# Patient Record
Sex: Female | Born: 1935 | Race: White | Hispanic: No | Marital: Married | Smoking: Current every day smoker
Health system: Southern US, Community
[De-identification: ages and names within clinical notes are randomized; demographics above are authoritative.]

## PROBLEM LIST (undated history)

## (undated) DIAGNOSIS — E7800 Pure hypercholesterolemia, unspecified: Secondary | ICD-10-CM

## (undated) DIAGNOSIS — K5909 Other constipation: Secondary | ICD-10-CM

## (undated) DIAGNOSIS — M1990 Unspecified osteoarthritis, unspecified site: Secondary | ICD-10-CM

## (undated) DIAGNOSIS — R159 Full incontinence of feces: Secondary | ICD-10-CM

## (undated) DIAGNOSIS — E119 Type 2 diabetes mellitus without complications: Secondary | ICD-10-CM

## (undated) DIAGNOSIS — I10 Essential (primary) hypertension: Secondary | ICD-10-CM

## (undated) DIAGNOSIS — N289 Disorder of kidney and ureter, unspecified: Secondary | ICD-10-CM

## (undated) DIAGNOSIS — F4322 Adjustment disorder with anxiety: Secondary | ICD-10-CM

## (undated) DIAGNOSIS — E876 Hypokalemia: Secondary | ICD-10-CM

## (undated) DIAGNOSIS — E079 Disorder of thyroid, unspecified: Secondary | ICD-10-CM

## (undated) DIAGNOSIS — F17200 Nicotine dependence, unspecified, uncomplicated: Secondary | ICD-10-CM

## (undated) HISTORY — DX: Full incontinence of feces: R15.9

## (undated) HISTORY — DX: Disorder of kidney and ureter, unspecified: N28.9

## (undated) HISTORY — DX: Other constipation: K59.09

## (undated) HISTORY — DX: Hypokalemia: E87.6

## (undated) HISTORY — DX: Nicotine dependence, unspecified, uncomplicated: F17.200

## (undated) HISTORY — DX: Adjustment disorder with anxiety: F43.22

## (undated) HISTORY — DX: Unspecified osteoarthritis, unspecified site: M19.90

---

## 2016-07-25 ENCOUNTER — Encounter (HOSPITAL_COMMUNITY): Payer: Self-pay | Admitting: Emergency Medicine

## 2016-07-25 ENCOUNTER — Emergency Department (HOSPITAL_COMMUNITY)
Admission: EM | Admit: 2016-07-25 | Discharge: 2016-07-25 | Disposition: A | Discharge status: Home / Self Care | Payer: Medicare Other | Attending: Emergency Medicine | Admitting: Emergency Medicine

## 2016-07-25 DIAGNOSIS — E119 Type 2 diabetes mellitus without complications: Secondary | ICD-10-CM | POA: Diagnosis not present

## 2016-07-25 DIAGNOSIS — M545 Low back pain: Secondary | ICD-10-CM | POA: Diagnosis present

## 2016-07-25 DIAGNOSIS — I10 Essential (primary) hypertension: Secondary | ICD-10-CM | POA: Diagnosis not present

## 2016-07-25 DIAGNOSIS — F17210 Nicotine dependence, cigarettes, uncomplicated: Secondary | ICD-10-CM | POA: Diagnosis not present

## 2016-07-25 DIAGNOSIS — M62830 Muscle spasm of back: Secondary | ICD-10-CM | POA: Diagnosis not present

## 2016-07-25 HISTORY — DX: Disorder of thyroid, unspecified: E07.9

## 2016-07-25 HISTORY — DX: Essential (primary) hypertension: I10

## 2016-07-25 HISTORY — DX: Pure hypercholesterolemia, unspecified: E78.00

## 2016-07-25 HISTORY — DX: Type 2 diabetes mellitus without complications: E11.9

## 2016-07-25 LAB — URINALYSIS, ROUTINE W REFLEX MICROSCOPIC
BILIRUBIN URINE: NEGATIVE
GLUCOSE, UA: NEGATIVE mg/dL
HGB URINE DIPSTICK: NEGATIVE
Ketones, ur: NEGATIVE mg/dL
Leukocytes, UA: NEGATIVE
Nitrite: NEGATIVE
PROTEIN: NEGATIVE mg/dL
Specific Gravity, Urine: 1.018 (ref 1.005–1.030)
pH: 5.5 (ref 5.0–8.0)

## 2016-07-25 LAB — CBC WITH DIFFERENTIAL/PLATELET
Basophils Absolute: 0 10*3/uL (ref 0.0–0.1)
Basophils Relative: 0 %
EOS ABS: 0.1 10*3/uL (ref 0.0–0.7)
EOS PCT: 1 %
HCT: 41 % (ref 36.0–46.0)
Hemoglobin: 14.2 g/dL (ref 12.0–15.0)
LYMPHS ABS: 0.9 10*3/uL (ref 0.7–4.0)
LYMPHS PCT: 6 %
MCH: 32.7 pg (ref 26.0–34.0)
MCHC: 34.6 g/dL (ref 30.0–36.0)
MCV: 94.5 fL (ref 78.0–100.0)
MONO ABS: 1.7 10*3/uL — AB (ref 0.1–1.0)
MONOS PCT: 11 %
Neutro Abs: 11.8 10*3/uL — ABNORMAL HIGH (ref 1.7–7.7)
Neutrophils Relative %: 82 %
PLATELETS: 232 10*3/uL (ref 150–400)
RBC: 4.34 MIL/uL (ref 3.87–5.11)
RDW: 13.7 % (ref 11.5–15.5)
WBC: 14.5 10*3/uL — ABNORMAL HIGH (ref 4.0–10.5)

## 2016-07-25 LAB — BASIC METABOLIC PANEL
Anion gap: 12 (ref 5–15)
BUN: 19 mg/dL (ref 6–20)
CHLORIDE: 98 mmol/L — AB (ref 101–111)
CO2: 28 mmol/L (ref 22–32)
CREATININE: 0.72 mg/dL (ref 0.44–1.00)
Calcium: 9.9 mg/dL (ref 8.9–10.3)
GFR calc Af Amer: >60 mL/min (ref >60)
GFR calc non Af Amer: >60 mL/min (ref >60)
GLUCOSE: 155 mg/dL — AB (ref 65–99)
POTASSIUM: 3.1 mmol/L — AB (ref 3.5–5.1)
SODIUM: 138 mmol/L (ref 135–145)

## 2016-07-25 MED ORDER — POTASSIUM CHLORIDE CRYS ER 20 MEQ PO TBCR
40.0000 meq | EXTENDED_RELEASE_TABLET | Freq: Once | ORAL | Status: DC
  Filled 2016-07-25: qty 2

## 2016-07-25 MED ORDER — FENTANYL CITRATE (PF) 100 MCG/2ML IJ SOLN
50.0000 ug | Freq: Once | INTRAMUSCULAR | Status: AC
  Administered 2016-07-25: 50 ug via INTRAVENOUS
  Filled 2016-07-25: qty 2

## 2016-07-25 MED ORDER — MORPHINE SULFATE (PF) 2 MG/ML IV SOLN
4.0000 mg | Freq: Once | INTRAVENOUS | Status: AC
  Administered 2016-07-25: 2 mg via INTRAVENOUS
  Filled 2016-07-25: qty 2

## 2016-07-25 NOTE — ED Notes (Signed)
Discharge instructions and follow up care reviewed with patient. Patient verbalized understanding. 

## 2016-07-25 NOTE — ED Notes (Signed)
Pt able to ambulate w/o assistance and with steady gait Pt rates pain across shoulder blades at 8/10 Pt is now c/o pain to her bilateral groin when ambulating Pt states "it's harder to get out bed but when I walk the pain is a little better"

## 2016-07-25 NOTE — ED Provider Notes (Signed)
[facility]-EMERGENCY DEPT Provider Note   CSN: [id number] Arrival date & time: 07/25/16  [id number]  By signing my name below, I, Emmanuella Mensah, attest that this documentation has been prepared under the direction and in the presence of Zadie Rhineonald Hosey Burmester, MD. Electronically Signed: Angelene GiovanniEmmanuella Mensah, ED Scribe. 07/25/16. 3:18 AM.   History   Chief Complaint Chief Complaint  Patient presents with  . Back Pain    HPI Comments: Rachel Flynn is a 81 y.o. female with a hx of DM, high cholesterol, and hypertension brought in by ambulance, who presents to the Emergency Department complaining of gradually worsening moderate pain to her entire spine onset yesterday morning when she woke up. She reports associated difficulty ambulating due to the pain. She notes that the pain is worse with deep breathing and movement. She explains that she has been having persistent moderate generalized headaches onset 06/12/16 that has been gradually radiating down her posterior neck and now the pain is in her spine. She adds that she has a scheduled brain MRI tomorrow because of these headaches. She denies any recent falls, injuries, or trauma. She states that she believes her symptoms are attributed to stress as she lost her son last year and another son is going through a divorce. No alleviating factors noted. She states that she took Aleve 2 hours ago with no relief. She denies any recent travel outside the country, unexpected weight loss, severe infections, or tick exposure. She also denies any fever, chills, chest pain, SOB, syncope, numbness/weakness to BLE, bowel/bladder incontinence, or any other symptoms.   The history is provided by the patient. No language interpreter was used.  Back Pain   This is a new problem. The current episode started yesterday. The problem has been gradually worsening. The pain is associated with no known injury. The pain is present in the thoracic spine and lumbar spine. The pain is moderate. The  pain is the same all the time. Associated symptoms include headaches. Pertinent negatives include no chest pain, no fever, no numbness, no bowel incontinence, no bladder incontinence and no weakness. She has tried NSAIDs for the symptoms.    Past Medical History:  Diagnosis Date  . Diabetes mellitus without complication (HCC)   . High cholesterol   . Hypertension   . Thyroid disease     There are no active problems to display for this patient.   History reviewed. No pertinent surgical history.  OB History    No data available       Home Medications    Prior to Admission medications   Not on File    Family History History reviewed. No pertinent family history.  Social History Social History  Substance Use Topics  . Smoking status: Current Every Day Smoker    Types: Cigarettes  . Smokeless tobacco: Never Used  . Alcohol use Yes     Allergies   Patient has no known allergies.   Review of Systems Review of Systems  Constitutional: Negative for chills, diaphoresis, fever and unexpected weight change.  Respiratory: Negative for shortness of breath.   Cardiovascular: Negative for chest pain.  Gastrointestinal: Negative for bowel incontinence.  Genitourinary: Negative for bladder incontinence.  Musculoskeletal: Positive for back pain and neck pain.  Neurological: Positive for headaches. Negative for syncope, weakness and numbness.  All other systems reviewed and are negative.    Physical Exam Updated Vital Signs BP 141/65   Pulse 94   Temp 98.9 F (37.2 C) (Oral)   Resp 17  SpO2 96%   Physical Exam  Nursing note and vitals reviewed.  CONSTITUTIONAL: Well developed/well nourished HEAD: Normocephalic/atraumatic EYES: EOMI/PERRL ENMT: Mucous membranes moist NECK: supple no meningeal signs SPINE/BACK:diffuse spinal tenderness; no erythema noted CV: S1/S2 noted, no murmurs/rubs/gallops noted LUNGS: Lungs are clear to auscultation bilaterally, no  apparent distress ABDOMEN: soft, nontender, no rebound or guarding, bowel sounds noted throughout abdomen GU:no cva tenderness NEURO: Pt is awake/alert/appropriate, moves all extremitiesx4.  No facial droop.  No arm or leg drift; equal power noted in BLE EXTREMITIES: pulses normal/equal, full ROM SKIN: warm, color normal PSYCH: Anxious    ED Treatments / Results  DIAGNOSTIC STUDIES: Oxygen Saturation is 96% on RA, normal by my interpretation.    COORDINATION OF CARE: 3:14 AM- Pt advised of plan for treatment and pt agrees. Pt will receive lab work for further evaluation. She will also receive Fentanyl IM.    Labs (all labs ordered are listed, but only abnormal results are displayed) Labs Reviewed  BASIC METABOLIC PANEL - Abnormal; Notable for the following:       Result Value   Potassium 3.1 (*)    Chloride 98 (*)    Glucose, Bld 155 (*)    All other components within normal limits  CBC WITH DIFFERENTIAL/PLATELET - Abnormal; Notable for the following:    WBC 14.5 (*)    Neutro Abs 11.8 (*)    Monocytes Absolute 1.7 (*)    All other components within normal limits  URINALYSIS, ROUTINE W REFLEX MICROSCOPIC (NOT AT [facility])    EKG  EKG Interpretation None       Radiology No results found.  Procedures Procedures (including critical care time)  Medications Ordered in ED Medications  potassium chloride SA (K-DUR,KLOR-CON) CR tablet 40 mEq (40 mEq Oral Refused 07/25/16 0508)  fentaNYL (SUBLIMAZE) injection 50 mcg (50 mcg Intravenous Given 07/25/16 0335)  morphine 2 MG/ML injection 4 mg (2 mg Intravenous Given 07/25/16 0453)     Initial Impression / Assessment and Plan / ED Course  Zadie Rhineonald Jaid Quirion, MD has reviewed the triage vital signs and the nursing notes.  Pertinent labs results that were available during my care of the patient were reviewed by me and considered in my medical decision making (see chart for details).  Clinical Course     Pt well appearing She has  no focal weakness in her lower extremities Her neck is supple She is now ambulatory On repeat exam she has diffuse muscle tenderness to her back   But no signs of trauma, no erythema noted No signs of acute neurologic emergency Her HA has been ongoing since September, defer further workup and she has MRI brain scheduled for tomorrow For her HA they have been monitoring her sed rate but did not have temporal arteritis per patient.     She will take an extra zanaflex for muscle spasms as she has this at home  Final Clinical Impressions(s) / ED Diagnoses   Final diagnoses:  Muscle spasm of back    New Prescriptions New Prescriptions   No medications on file   I personally performed the services described in this documentation, which was scribed in my presence. The recorded information has been reviewed and is accurate.        Zadie Rhineonald Jeanie Mccard, MD 07/25/16 [phone]

## 2016-07-25 NOTE — ED Notes (Signed)
Pt able to ambulate to the bathroom with standby assist Pt with steady gait

## 2016-07-25 NOTE — Discharge Instructions (Signed)

## 2016-07-25 NOTE — ED Notes (Signed)
Pt is scheduled for a MRI at [facility] on November 8

## 2016-07-25 NOTE — ED Triage Notes (Signed)
Pt brought in by EMS with c/o back pain  Pt states she thinks it is from stress  Denies injury or fall  Pt denies neck pain  Pt states she is scheduled for a MRI at [facility] on Nov 8th

## 2016-07-25 NOTE — ED Notes (Signed)
ED Provider at bedside. 

## 2017-12-05 ENCOUNTER — Ambulatory Visit (HOSPITAL_COMMUNITY): Payer: Medicare Other | Admitting: Psychiatry

## 2019-10-27 ENCOUNTER — Ambulatory Visit: Payer: Medicare Other | Admitting: Podiatry

## 2020-02-03 ENCOUNTER — Encounter: Payer: Self-pay | Admitting: *Deleted

## 2020-02-04 ENCOUNTER — Ambulatory Visit (INDEPENDENT_AMBULATORY_CARE_PROVIDER_SITE_OTHER): Payer: Medicare Other | Admitting: Gastroenterology

## 2020-02-04 ENCOUNTER — Encounter: Payer: Self-pay | Admitting: Gastroenterology

## 2020-02-04 VITALS — BP 124/76 | HR 85 | Ht 63.5 in | Wt 148.4 lb

## 2020-02-04 DIAGNOSIS — R194 Change in bowel habit: Secondary | ICD-10-CM | POA: Insufficient documentation

## 2020-02-04 DIAGNOSIS — K5900 Constipation, unspecified: Secondary | ICD-10-CM

## 2020-02-04 DIAGNOSIS — R159 Full incontinence of feces: Secondary | ICD-10-CM | POA: Diagnosis not present

## 2020-02-04 NOTE — Progress Notes (Signed)
02/04/2020 Rachel Flynn [id number] 1935-03-16   HISTORY OF PRESENT ILLNESS: This is a pleasant 85 year old female who is new to our office.  Has been referred here by Dr. Rozetta Nunnery for evaluation regarding fecal incontinence and diarrhea.  The patient tells me that she does not have diarrhea.  She says that all of her life she has not had any problems moving her bowels.  She says that in January she took a medication for a couple of days that given to her by her podiatrist and it seemed to constipate her.  She has had some issues with what sounds like just mild constipation since that time.  Then, what prompted this visit was that over the past couple weeks she has had 2 episodes of fecal incontinence.  She says that the stools were not diarrhea, but were softer than normal, just urgent and came out.  She says that her last colonoscopy was over 10 years ago.  Has had them regularly all her life.  Recalls that some point in the past she had polyps removed.  Unfortunately we do not have any records of those.  She denies any rectal bleeding.  Denies any weight loss.  Denies any abdominal pain.   Past Medical History:  Diagnosis Date  . Adjustment disorder with anxious mood   . Arthritis   . Chronic constipation   . Diabetes mellitus without complication ([place])   . Fecal incontinence   . High cholesterol   . Hypertension   . Hypokalemia   . Renal insufficiency   . Thyroid disease   . Tobacco dependence    History reviewed. No pertinent surgical history.  reports that she has been smoking cigarettes. She has never used smokeless tobacco. She reports current alcohol use. She reports that she does not use drugs. family history is not on file. Allergies  Allergen Reactions  . Valsartan Swelling    Edema  . Nifedipine Other (See Comments)    Constipation  . Ace Inhibitors Other (See Comments)    Cough Cough   . Amoxicillin-Pot Clavulanate Nausea And Vomiting  . Sertraline Nausea Only        Outpatient Encounter Medications as of 02/04/2020  Medication Sig  . amLODipine (NORVASC) 5 MG tablet Take 5 mg by mouth every morning.  Marland Kitchen aspirin 81 MG EC tablet Take by mouth.  Marland Kitchen atorvastatin (LIPITOR) 40 MG tablet Take 40 mg by mouth daily.  . Blood Glucose Monitoring Suppl (ONETOUCH VERIO) w/Device KIT Use as directed  . Desoximetasone 0.05 % GEL Apply 1 application topically daily as needed (skin).   Marland Kitchen glucose blood (ONETOUCH VERIO) test strip CHECK BLOOD SUGARS DAILY AND AS NEEDED (E11.9)  . ibuprofen (ADVIL,MOTRIN) 600 MG tablet Take 600 mg by mouth every 8 (eight) hours as needed for mild pain or moderate pain.   Marland Kitchen LORazepam (ATIVAN) 0.5 MG tablet Take by mouth.  . nabumetone (RELAFEN) 500 MG tablet Take by mouth.  Marland Kitchen NIFEdipine (PROCARDIA-XL/NIFEDICAL-XL) 30 MG 24 hr tablet Take by mouth.  . Omega-3 1000 MG CAPS Take by mouth.  Glory Rosebush Delica Lancets 62H MISC Use 1 lancet daily to test blood sugar (dx E11.9)  . Tafluprost, PF, 0.0015 % SOLN Place 1 drop into both eyes nightly.  . tizanidine (ZANAFLEX) 2 MG capsule Take by mouth.  . valsartan-hydrochlorothiazide (DIOVAN-HCT) 320-12.5 MG tablet Take 1 tablet by mouth every morning.  . [DISCONTINUED] escitalopram (LEXAPRO) 5 MG tablet Take 5 mg by mouth every morning.  . [  DISCONTINUED] levothyroxine (SYNTHROID, LEVOTHROID) 100 MCG tablet Take 100 mcg by mouth every morning.  . [DISCONTINUED] metFORMIN (GLUCOPHAGE) 500 MG tablet Take 500 mg by mouth 2 (two) times daily.   No facility-administered encounter medications on file as of 02/04/2020.     REVIEW OF SYSTEMS  : All other systems reviewed and negative except where noted in the History of Present Illness.   PHYSICAL EXAM: BP 124/76   Pulse 85   Ht 5' 3.5" (1.613 m)   Wt 148 lb 6 oz (67.3 kg)   BMI 25.87 kg/m  General: Well developed white female in no acute distress Head: Normocephalic and atraumatic Eyes:  Sclerae anicteric, conjunctiva pink. Ears: Normal auditory  acuity Lungs: Clear throughout to auscultation; no increased WOB. Heart: Regular rate and rhythm; no M/R/G. Abdomen: Soft, non-distended.  BS present.  Non-tender. Musculoskeletal: Symmetrical with no gross deformities  Skin: No lesions on visible extremities Extremities: No edema  Neurological: Alert oriented x 4, grossly non-focal Psychological:  Alert and cooperative. Normal mood and affect  ASSESSMENT AND PLAN: *85 year old female with change in bowel habits with mild constipation since January and then 2 episodes of fecal incontinence over the past couple of weeks.  Has not had a colonoscopy in well over 10 years.  We discussed this as she is in very good health.  She will consider this.  Incontinence episodes likely due to pelvic floor weakness/sphincter weakness.  We discussed Kegel exercises and she was given literature on this.  She will also start a powder fiber supplement such as Benefiber, 2 teaspoons in 8 ounces of liquid daily.  Can increase to twice daily if needed.  I have asked her to call us back in about 4 to 5 weeks with an update on her symptoms and to discuss on if she wants to proceed with colonoscopy.   CC:  Loraine Leriche.,* CC:  Dr. Rozetta Nunnery

## 2020-02-04 NOTE — Progress Notes (Signed)
Agree with the assessment and plan as outlined by [name], PA-C. ? ?Rachel Dunlevy, DO, FACG ? ?

## 2020-02-04 NOTE — Patient Instructions (Signed)
If you are age 83 or older, your body mass index should be between 23-30. Your Body mass index is 25.87 kg/m. If this is out of the aforementioned range listed, please consider follow up with your Primary Care Provider.  If you are age 9 or younger, your body mass index should be between 19-25. Your Body mass index is 25.87 kg/m. If this is out of the aformentioned range listed, please consider follow up with your Primary Care Provider.   Please purchase Benefiber over the counter. Take 2 tablespoon in 8 ounces of liquid daily and increase to twice daily if needed.  Call back in 4-5 weeks with an update, ask for [employer].  Consider scheduling colonoscopy.  Kegel Exercises  Kegel exercises can help strengthen your pelvic floor muscles. The pelvic floor is a group of muscles that support your rectum, small intestine, and bladder. In females, pelvic floor muscles also help support the womb (uterus). These muscles help you control the flow of urine and stool. Kegel exercises are painless and simple, and they do not require any equipment. Your provider may suggest Kegel exercises to:  Improve bladder and bowel control.  Improve sexual response.  Improve weak pelvic floor muscles after surgery to remove the uterus (hysterectomy) or pregnancy (females).  Improve weak pelvic floor muscles after prostate gland removal or surgery (males). Kegel exercises involve squeezing your pelvic floor muscles, which are the same muscles you squeeze when you try to stop the flow of urine or keep from passing gas. The exercises can be done while sitting, standing, or lying down, but it is best to vary your position. Exercises How to do Kegel exercises: 1. Squeeze your pelvic floor muscles tight. You should feel a tight lift in your rectal area. If you are a female, you should also feel a tightness in your vaginal area. Keep your stomach, buttocks, and legs relaxed. 2. Hold the muscles tight for up to 10  seconds. 3. Breathe normally. 4. Relax your muscles. 5. Repeat as told by your health care provider. Repeat this exercise daily as told by your health care provider. Continue to do this exercise for at least 4-6 weeks, or for as long as told by your health care provider. You may be referred to a physical therapist who can help you learn more about how to do Kegel exercises. Depending on your condition, your health care provider may recommend:  Varying how long you squeeze your muscles.  Doing several sets of exercises every day.  Doing exercises for several weeks.  Making Kegel exercises a part of your regular exercise routine. This information is not intended to replace advice given to you by your health care provider. Make sure you discuss any questions you have with your health care provider. Document Revised: 04/24/2018 Document Reviewed: 04/24/2018 Elsevier Patient Education  2020 [employer].

## 2021-12-09 ENCOUNTER — Emergency Department (HOSPITAL_BASED_OUTPATIENT_CLINIC_OR_DEPARTMENT_OTHER): Payer: Medicare Other

## 2021-12-09 ENCOUNTER — Encounter (HOSPITAL_BASED_OUTPATIENT_CLINIC_OR_DEPARTMENT_OTHER): Payer: Self-pay | Admitting: Emergency Medicine

## 2021-12-09 ENCOUNTER — Emergency Department (HOSPITAL_BASED_OUTPATIENT_CLINIC_OR_DEPARTMENT_OTHER)
Admission: EM | Admit: 2021-12-09 | Discharge: 2021-12-09 | Disposition: A | Discharge status: Home / Self Care | Payer: Medicare Other | Attending: Emergency Medicine | Admitting: Emergency Medicine

## 2021-12-09 DIAGNOSIS — I10 Essential (primary) hypertension: Secondary | ICD-10-CM | POA: Insufficient documentation

## 2021-12-09 DIAGNOSIS — Z7982 Long term (current) use of aspirin: Secondary | ICD-10-CM | POA: Insufficient documentation

## 2021-12-09 DIAGNOSIS — Y9301 Activity, walking, marching and hiking: Secondary | ICD-10-CM | POA: Diagnosis not present

## 2021-12-09 DIAGNOSIS — M79642 Pain in left hand: Secondary | ICD-10-CM | POA: Diagnosis not present

## 2021-12-09 DIAGNOSIS — Z87891 Personal history of nicotine dependence: Secondary | ICD-10-CM | POA: Diagnosis not present

## 2021-12-09 DIAGNOSIS — W01198A Fall on same level from slipping, tripping and stumbling with subsequent striking against other object, initial encounter: Secondary | ICD-10-CM | POA: Diagnosis not present

## 2021-12-09 DIAGNOSIS — S0181XA Laceration without foreign body of other part of head, initial encounter: Secondary | ICD-10-CM | POA: Diagnosis not present

## 2021-12-09 DIAGNOSIS — S62613A Displaced fracture of proximal phalanx of left middle finger, initial encounter for closed fracture: Secondary | ICD-10-CM | POA: Diagnosis not present

## 2021-12-09 DIAGNOSIS — S62615A Displaced fracture of proximal phalanx of left ring finger, initial encounter for closed fracture: Secondary | ICD-10-CM | POA: Diagnosis not present

## 2021-12-09 DIAGNOSIS — S6992XA Unspecified injury of left wrist, hand and finger(s), initial encounter: Secondary | ICD-10-CM | POA: Diagnosis present

## 2021-12-09 DIAGNOSIS — Z79899 Other long term (current) drug therapy: Secondary | ICD-10-CM | POA: Diagnosis not present

## 2021-12-09 DIAGNOSIS — E119 Type 2 diabetes mellitus without complications: Secondary | ICD-10-CM | POA: Diagnosis not present

## 2021-12-09 MED ORDER — ACETAMINOPHEN 325 MG PO TABS
650.0000 mg | ORAL_TABLET | Freq: Once | ORAL | Status: AC
  Administered 2021-12-09: 650 mg via ORAL
  Filled 2021-12-09: qty 2

## 2021-12-09 MED ORDER — TETANUS-DIPHTH-ACELL PERTUSSIS 5-2.5-18.5 LF-MCG/0.5 IM SUSY
0.5000 mL | PREFILLED_SYRINGE | Freq: Once | INTRAMUSCULAR | Status: DC
  Filled 2021-12-09: qty 0.5

## 2021-12-09 MED ORDER — OXYCODONE-ACETAMINOPHEN 5-325 MG PO TABS
1.0000 | ORAL_TABLET | Freq: Once | ORAL | Status: DC
Start: 2021-12-09 — End: 2021-12-09
  Filled 2021-12-09: qty 1

## 2021-12-09 NOTE — ED Triage Notes (Signed)
Pt presents with laceration to left eyebrow and to left middle finger post mechanical fall. Endorses headache. Denies loc, blood thinners. ?

## 2021-12-09 NOTE — ED Triage Notes (Signed)
Pt arrived ems after tripping and falling  denies loc denies back pain  no blood thinners  has lac to left forehead, and finger lac ?

## 2021-12-09 NOTE — Discharge Instructions (Signed)
Please call Dr. Debby Bud office Monday morning to schedule an appointment.  Please take Tylenol and/or ibuprofen for pain.  Return to the emergency department for any worsening symptoms. ?

## 2021-12-09 NOTE — ED Provider Notes (Signed)
?[facility] ?Provider Note ? ? ?CSN: [id number] ?Arrival date & time: 12/09/21  1355 ? ?  ? ?History ? ?Chief Complaint  ?Patient presents with  ? Fall  ? Facial Laceration  ? ? ?Rachel Flynn is a 87 y.o. female who presents to the emergency department with left hand pain and a facial laceration that occurred after mechanical trip and fall earlier today.  Patient's was walking when the sole of her shoe got stuck on the floor and she fell forward.  She did hit her head but did not lose consciousness.  She is not anticoagulated.  She complains of a facial laceration localized to the left eyebrow and left hand pain and swelling.  She denies any other injury. ? ? ?Fall ? ? ?  ? ?Home Medications ?Prior to Admission medications   ?Medication Sig Start Date End Date Taking? Authorizing Provider  ?amLODipine (NORVASC) 5 MG tablet Take 5 mg by mouth every morning. 07/06/16   [provider]  ?aspirin 81 MG EC tablet Take by mouth.    [provider]  ?atorvastatin (LIPITOR) 40 MG tablet Take 40 mg by mouth daily. 05/19/16   [provider]  ?Blood Glucose Monitoring Suppl (ONETOUCH VERIO) w/Device KIT Use as directed 10/29/19   [provider]  ?Desoximetasone 0.05 % GEL Apply 1 application topically daily as needed (skin).  05/30/16   [provider]  ?glucose blood (ONETOUCH VERIO) test strip CHECK BLOOD SUGARS DAILY AND AS NEEDED (E11.9) 01/06/20   [provider]  ?ibuprofen (ADVIL,MOTRIN) 600 MG tablet Take 600 mg by mouth every 8 (eight) hours as needed for mild pain or moderate pain.  07/08/16   [provider]  ?nabumetone (RELAFEN) 500 MG tablet Take by mouth. 09/09/19   [provider]  ?NIFEdipine (PROCARDIA-XL/NIFEDICAL-XL) 30 MG 24 hr tablet Take by mouth. 10/21/19   [provider]  ?Omega-3 1000 MG CAPS Take by mouth.    [provider]  ?Jonetta Speak Lancets 94R MISC Use 1 lancet daily to test  blood sugar (dx E11.9) 05/03/16   [provider]  ?Tafluprost, PF, 0.0015 % SOLN Place 1 drop into both eyes nightly. 08/05/18   [provider]  ?tizanidine (ZANAFLEX) 2 MG capsule Take by mouth. 09/30/19   [provider]  ?valsartan-hydrochlorothiazide (DIOVAN-HCT) 320-12.5 MG tablet Take 1 tablet by mouth every morning. 06/26/16   [provider]  ?   ? ?Allergies    ?Valsartan, Nifedipine, Ace inhibitors, Amoxicillin-pot clavulanate, and Sertraline   ? ?Review of Systems   ?Review of Systems  ?All other systems reviewed and are negative. ? ?Physical Exam ?Updated Vital Signs ?BP (!) 175/70   Pulse (!) 55   Temp 98.8 ?F (37.1 ?C) (Oral)   Resp 18   SpO2 95%  ?Physical Exam ?Vitals and nursing note reviewed.  ?Constitutional:   ?   General: She is not in acute distress. ?   Appearance: Normal appearance.  ?HENT:  ?   Head: Normocephalic.  ?   Comments: 4 cm well approximated superficial linear laceration above the left eyebrow.  Bleeding is controlled.  No tenderness to the orbits.  No obvious deformity.  Patient tracks appropriately without any entrapment. ?Eyes:  ?   General:     ?   Right eye: No discharge.     ?   Left eye: No discharge.  ?Cardiovascular:  ?   Comments: Regular rate and rhythm.  S1/S2 are distinct without  any evidence of murmur, rubs, or gallops.  Radial pulses are 2+ bilaterally.  Dorsalis pedis pulses are 2+ bilaterally.  No evidence of pedal edema. ?Pulmonary:  ?   Comments: Clear to auscultation bilaterally.  Normal effort.  No respiratory distress.  No evidence of wheezes, rales, or rhonchi heard throughout. ?Abdominal:  ?   General: Abdomen is flat. Bowel sounds are normal. There is no distension.  ?   Tenderness: There is no abdominal tenderness. There is no guarding or rebound.  ?Musculoskeletal:     ?   General: Normal range of motion.  ?   Cervical back: Neck supple.  ?   Comments: Swelling and ecchymosis to the third and fourth fingers on the  left hand.  2+ radial pulse felt on the left wrist.  ?Skin: ?   General: Skin is warm and dry.  ?   Findings: No rash.  ?Neurological:  ?   General: No focal deficit present.  ?   Mental Status: She is alert.  ?Psychiatric:     ?   Mood and Affect: Mood normal.     ?   Behavior: Behavior normal.  ? ? ?ED Results / Procedures / Treatments   ?Labs ?(all labs ordered are listed, but only abnormal results are displayed) ?Labs Reviewed - No data to display ? ?EKG ?None ? ?Radiology ?CT Head Wo Contrast ? ?Result Date: 12/09/2021 ?CLINICAL DATA:  87 year old female with a history of head trauma EXAM: CT HEAD WITHOUT CONTRAST CT MAXILLOFACIAL WITHOUT CONTRAST CT CERVICAL SPINE WITHOUT CONTRAST TECHNIQUE: Multidetector CT imaging of the head, cervical spine, and maxillofacial structures were performed using the standard protocol without intravenous contrast. Multiplanar CT image reconstructions of the cervical spine and maxillofacial structures were also generated. RADIATION DOSE REDUCTION: This exam was performed according to the departmental dose-optimization program which includes automated exposure control, adjustment of the mA and/or kV according to patient size and/or use of iterative reconstruction technique. COMPARISON:  01/28/2021 FINDINGS: CT HEAD FINDINGS Brain: No acute intracranial hemorrhage. No midline shift or mass effect. Gray-white differentiation maintained. Mild volume loss. Minimal periventricular patchy hypodensity. Unremarkable appearance of the ventricular system. Vascular: Calcifications of the intracranial vasculature Skull: No acute fracture.  No aggressive bone lesion identified. Sinuses/Orbits: Unremarkable appearance of the orbits. Mastoid air cells clear. No middle ear effusion. No significant sinus disease. CT MAXILLOFACIAL FINDINGS Osseous: No fracture or mandibular dislocation. No destructive process. Orbits: Unremarkable orbits. Sinuses: Unremarkable appearance of the paranasal sinuses.  Soft tissues: Unremarkable CT CERVICAL SPINE FINDINGS Alignment: Craniocervical junction aligned. Anatomic alignment of the cervical elements. No subluxation. Skull base and vertebrae: No acute fracture at the skullbase. Vertebral body heights relatively maintained. No acute fracture identified. Soft tissues and spinal canal: Unremarkable cervical soft tissues. Lymph nodes are present, though not enlarged. Disc levels: Disc space narrowing most pronounced spanning the levels of C3-C7 with endplate sclerosis uncovertebral joint disease and anterior osteophyte production. No significant bony canal narrowing. Uncovertebral joint disease and associated facet disease contributes to varying degrees of bilateral neural foraminal narrowing. Upper chest: Unremarkable appearance of the lung apices. Other: Carotid calcifications. IMPRESSION: Head CT: Negative for intracranial abnormality Maxillofacial CT: Negative Cervical spine CT: No acute fracture or malalignment of the cervical spine. Electronically Signed   By: Corrie Mckusick D.O.   On: 12/09/2021 15:10  ? ?CT Cervical Spine Wo Contrast ? ?Result Date: 12/09/2021 ?CLINICAL DATA:  87 year old female with a history of head trauma EXAM: CT HEAD WITHOUT CONTRAST CT MAXILLOFACIAL WITHOUT  CONTRAST CT CERVICAL SPINE WITHOUT CONTRAST TECHNIQUE: Multidetector CT imaging of the head, cervical spine, and maxillofacial structures were performed using the standard protocol without intravenous contrast. Multiplanar CT image reconstructions of the cervical spine and maxillofacial structures were also generated. RADIATION DOSE REDUCTION: This exam was performed according to the departmental dose-optimization program which includes automated exposure control, adjustment of the mA and/or kV according to patient size and/or use of iterative reconstruction technique. COMPARISON:  01/28/2021 FINDINGS: CT HEAD FINDINGS Brain: No acute intracranial hemorrhage. No midline shift or mass effect.  Gray-white differentiation maintained. Mild volume loss. Minimal periventricular patchy hypodensity. Unremarkable appearance of the ventricular system. Vascular: Calcifications of the intracranial vasculature Skull

## 2022-05-02 IMAGING — CT CT HEAD W/O CM
3 series · 14 of 47 positions shown, 16 images · non-contrast
Comparison: 01/28/2021

CLINICAL DATA: 86-year-old female with a history of head trauma



[Series 2: head wo · axial · 0.42mm/px · z∈[-183,-53]mm · 8 of 32 slices shown, 10 images]
[im 3/32  brain]
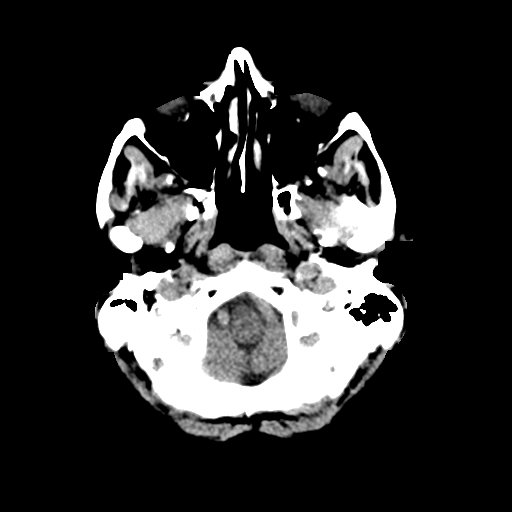
[im 3/32  bone]
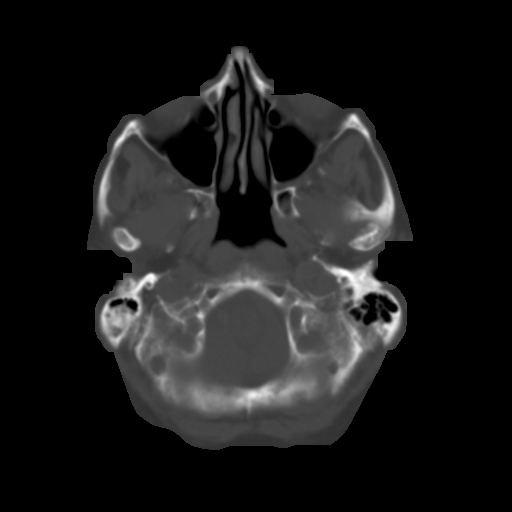
[im 7/32  brain]
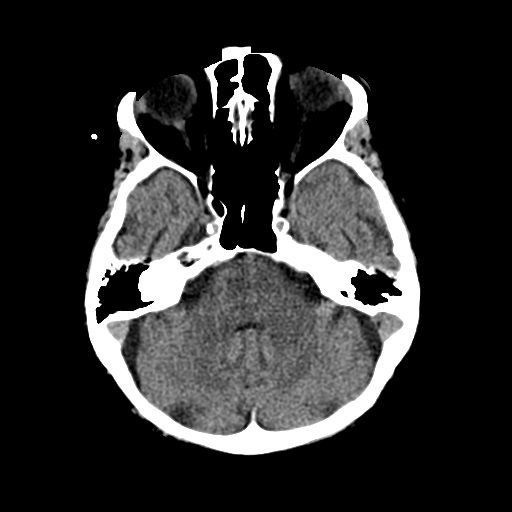
[im 10/32  brain]
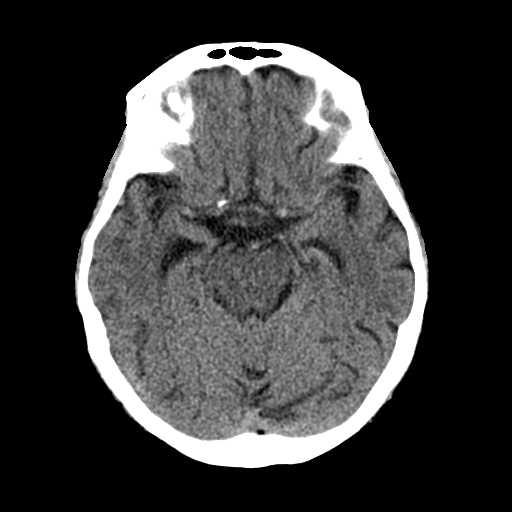
[im 14/32  brain]
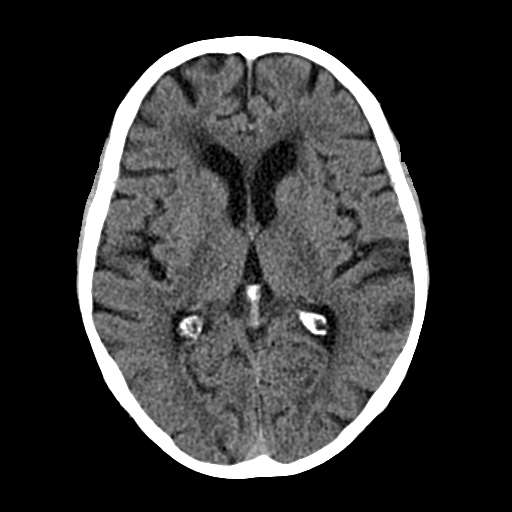
[im 18/32  brain]
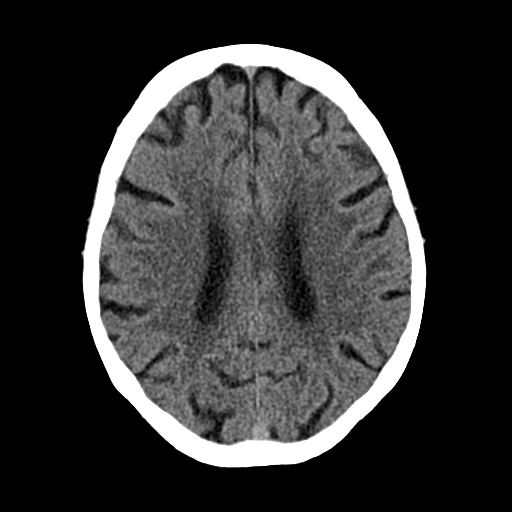
[im 18/32  bone]
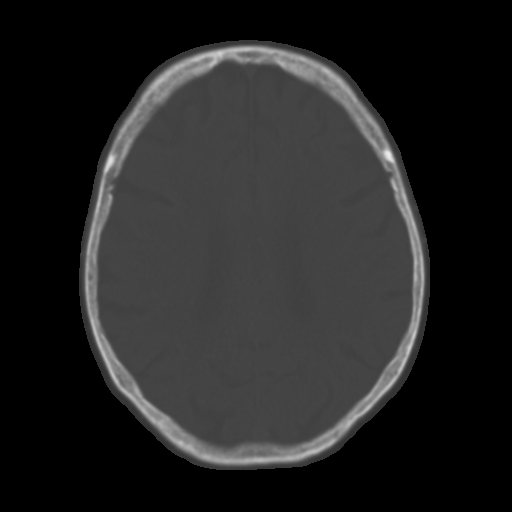
[im 22/32  brain]
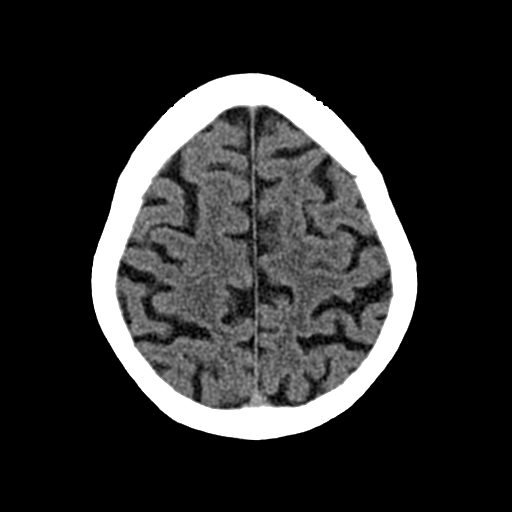
[im 25/32  brain]
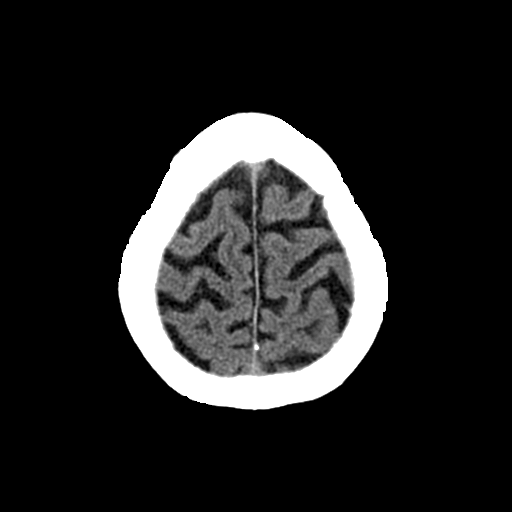
[im 29/32  brain]
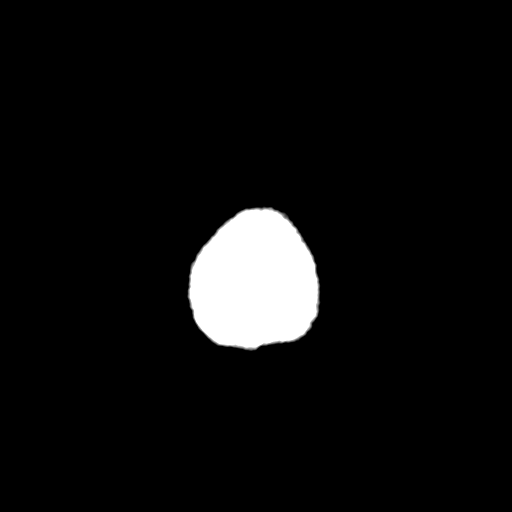

[Series 4: head coronal · coronal · 0.32mm/px · 3 of 66 slices shown]
[im 22/66  brain]
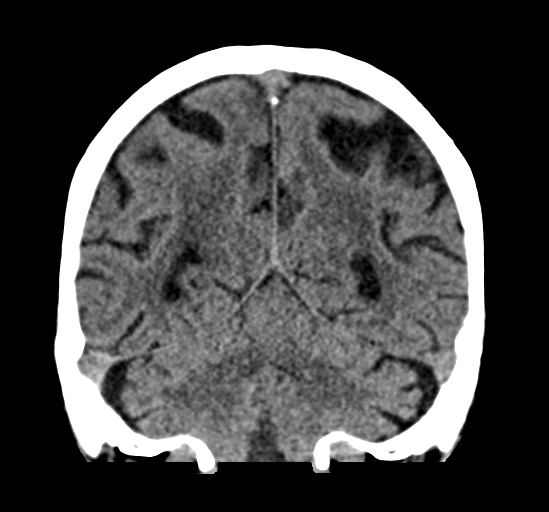
[im 29/66  brain]
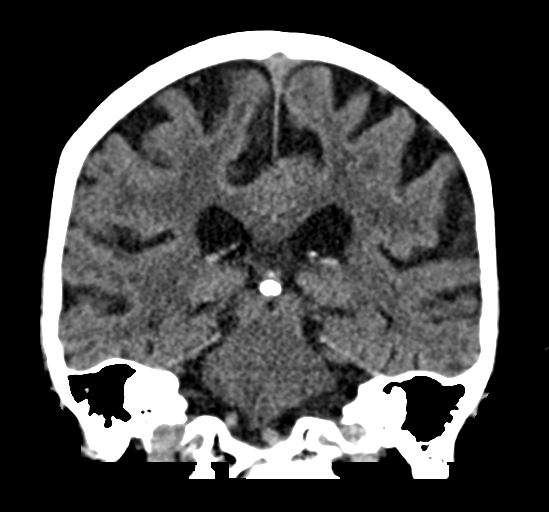
[im 37/66  brain]
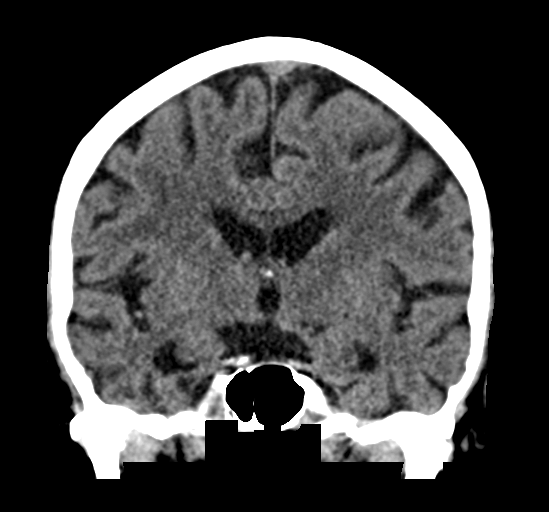

[Series 5: head sagittal · sagittal · 0.32mm/px · 3 of 54 slices shown]
[im 18/54  brain]
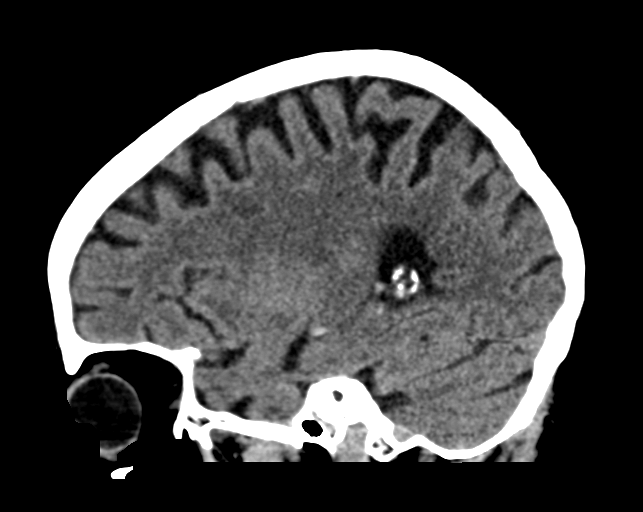
[im 27/54  brain]
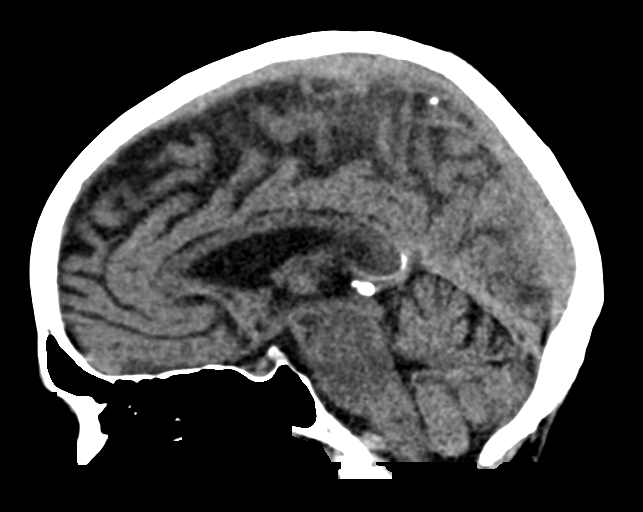
[im 36/54  brain]
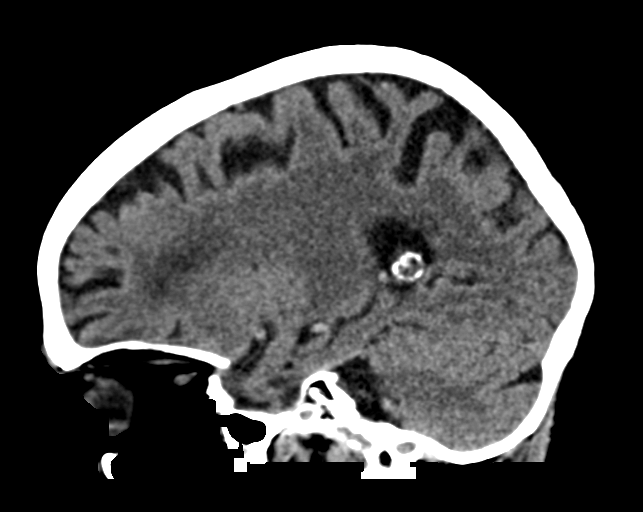

[14 of 47 positions shown; findings below may reference images not displayed]

FINDINGS: CT HEAD FINDINGS

Brain: No acute intracranial hemorrhage. No midline shift or mass
effect. Gray-white differentiation maintained. Mild volume loss.
Minimal periventricular patchy hypodensity. Unremarkable appearance
of the ventricular system.

Vascular: Calcifications of the intracranial vasculature

Skull: No acute fracture.  No aggressive bone lesion identified.

Sinuses/Orbits: Unremarkable appearance of the orbits. Mastoid air
cells clear. No middle ear effusion. No significant sinus disease.

CT MAXILLOFACIAL FINDINGS

Osseous: No fracture or mandibular dislocation. No destructive
process.

Orbits: Unremarkable orbits.

Sinuses: Unremarkable appearance of the paranasal sinuses.

Soft tissues: Unremarkable

CT CERVICAL SPINE FINDINGS

Alignment: Craniocervical junction aligned. Anatomic alignment of
the cervical elements. No subluxation.

Skull base and vertebrae: No acute fracture at the skullbase.
Vertebral body heights relatively maintained. No acute fracture
identified.

Soft tissues and spinal canal: Unremarkable cervical soft tissues.
Lymph nodes are present, though not enlarged.

Disc levels: Disc space narrowing most pronounced spanning the
levels of C3-C7 with endplate sclerosis uncovertebral joint disease
and anterior osteophyte production. No significant bony canal
narrowing. Uncovertebral joint disease and associated facet disease
contributes to varying degrees of bilateral neural foraminal
narrowing.

Upper chest: Unremarkable appearance of the lung apices.

Other: Carotid calcifications.
IMPRESSION: Head CT:

Negative for intracranial abnormality

Maxillofacial CT:

Negative

Cervical spine CT:

No acute fracture or malalignment of the cervical spine.

## 2022-05-02 IMAGING — CT CT MAXILLOFACIAL W/O CM
3 series · 15 of 47 positions shown, 18 images · non-contrast
Comparison: 01/28/2021

CLINICAL DATA: 86-year-old female with a history of head trauma



[Series 2: max soft · axial · 0.31mm/px · z∈[-282,-150]mm · 9 of 78 slices shown, 12 images]
[im 6/78  brain]
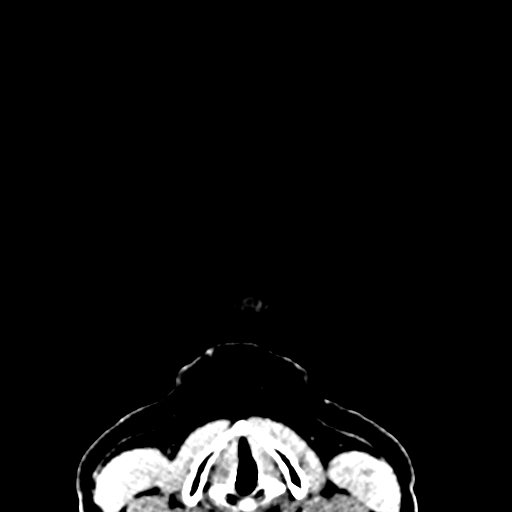
[im 6/78  bone]
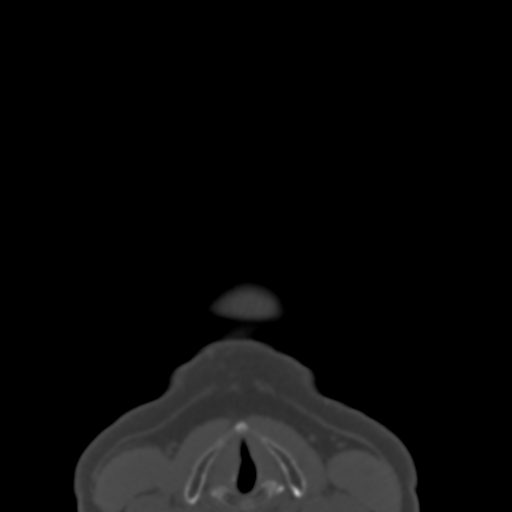
[im 14/78  bone]
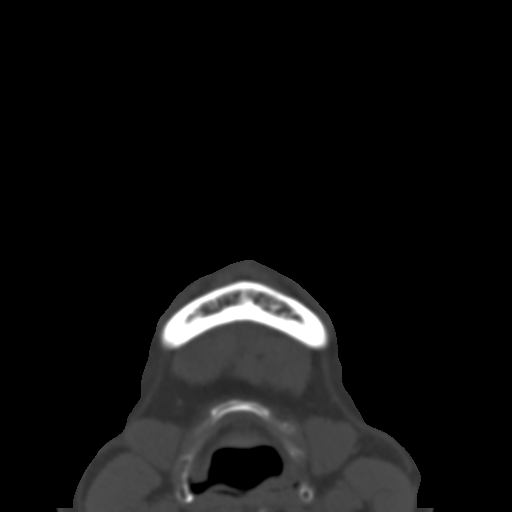
[im 22/78  bone]
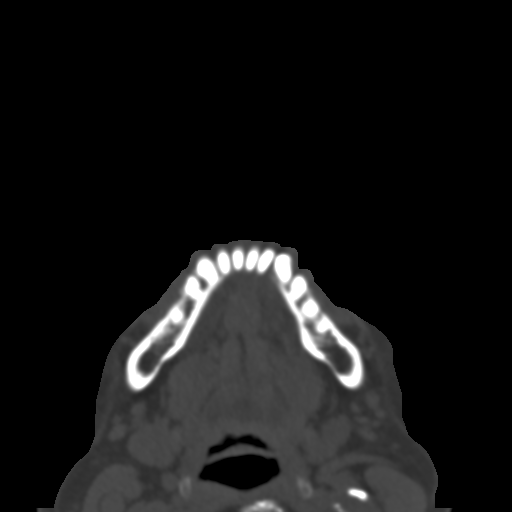
[im 30/78  bone]
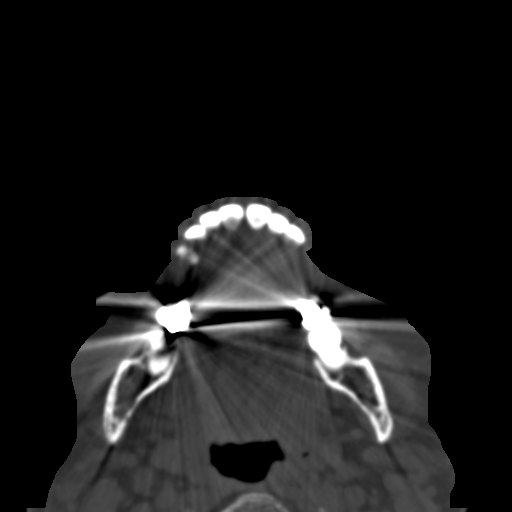
[im 40/78  brain]
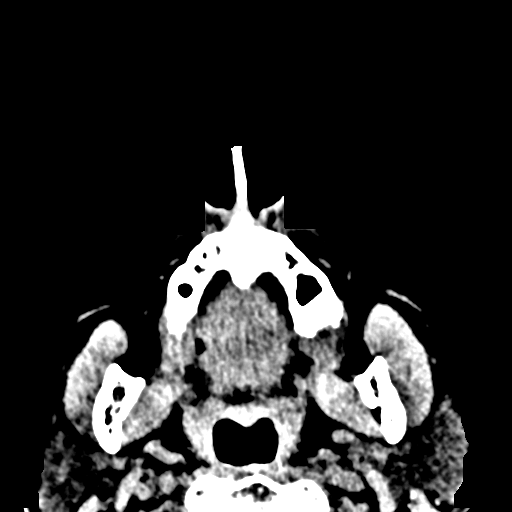
[im 40/78  bone]
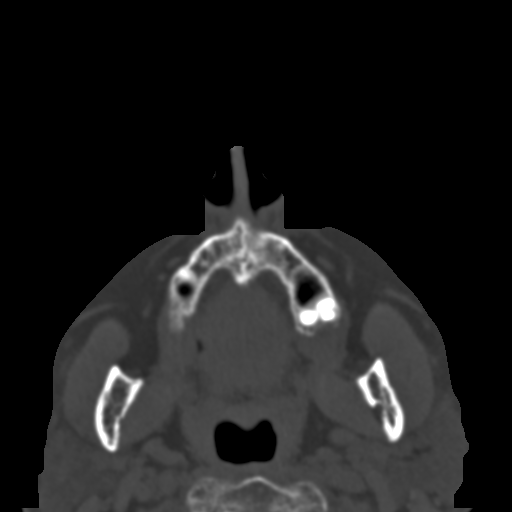
[im 48/78  bone]
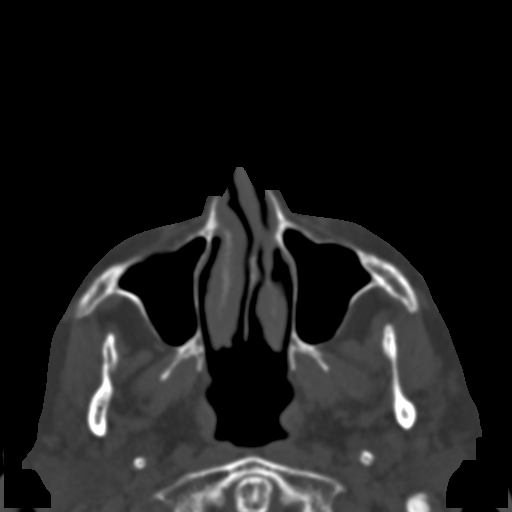
[im 56/78  bone]
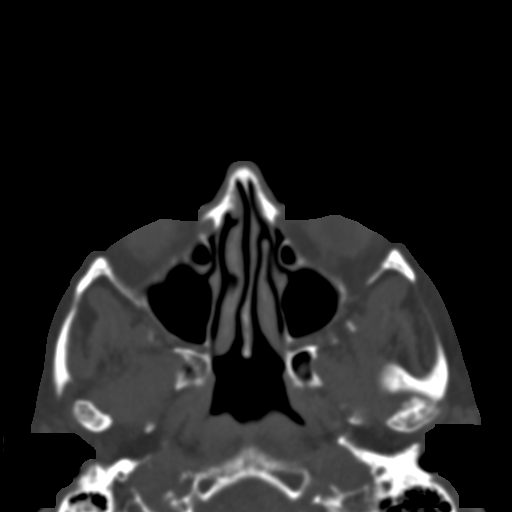
[im 64/78  bone]
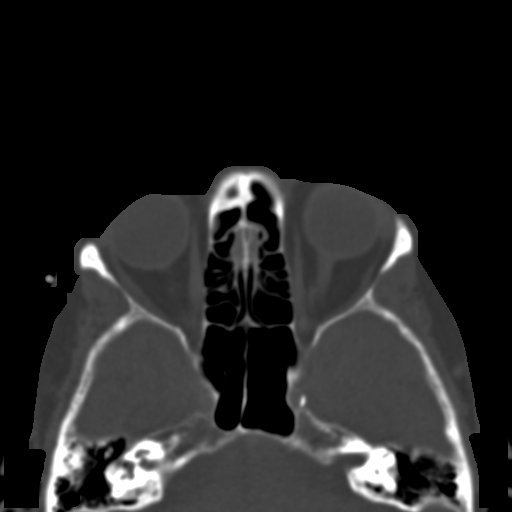
[im 72/78  brain]
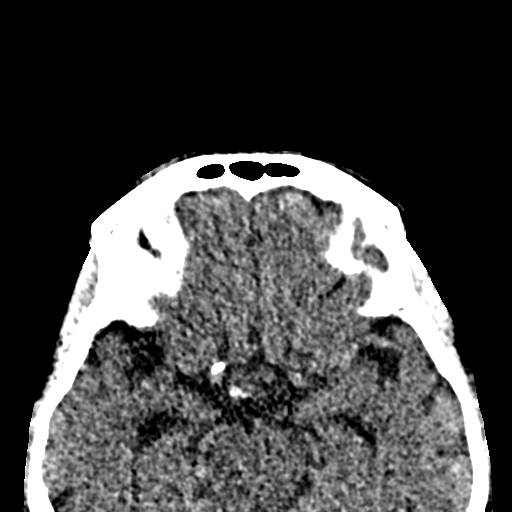
[im 72/78  bone]
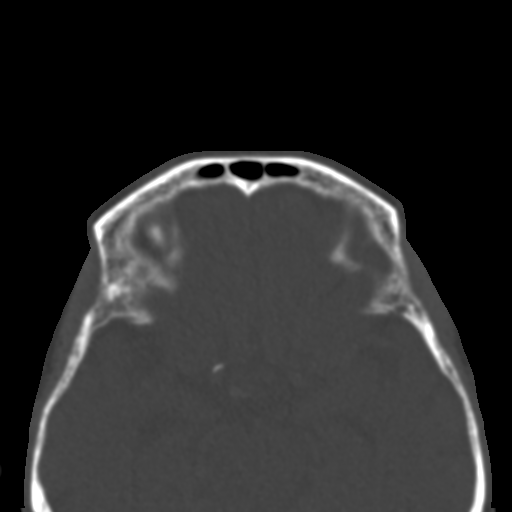

[Series 6: coronal soft · coronal · 0.30mm/px · 3 of 63 slices shown]
[im 21/63  bone]
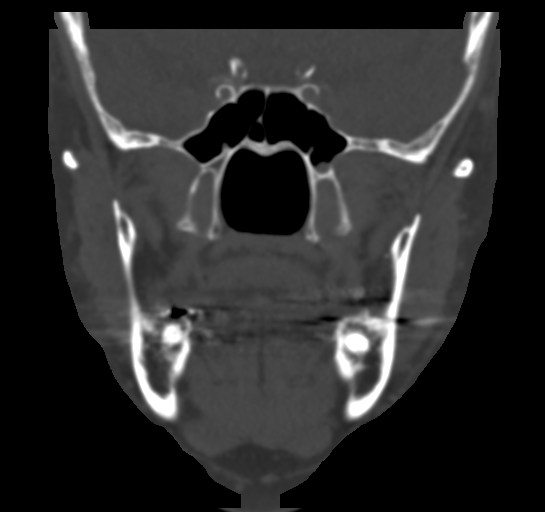
[im 28/63  bone]
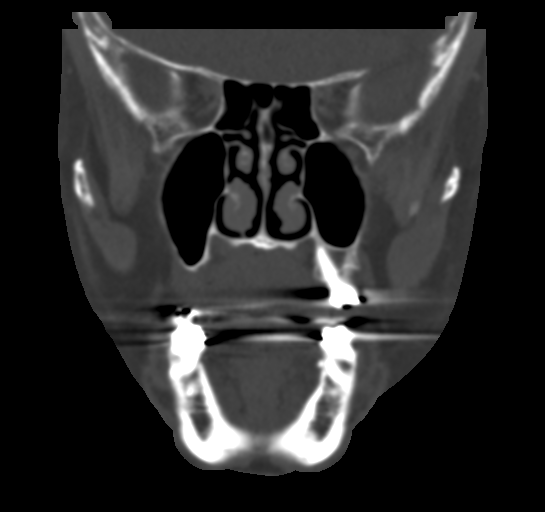
[im 35/63  bone]
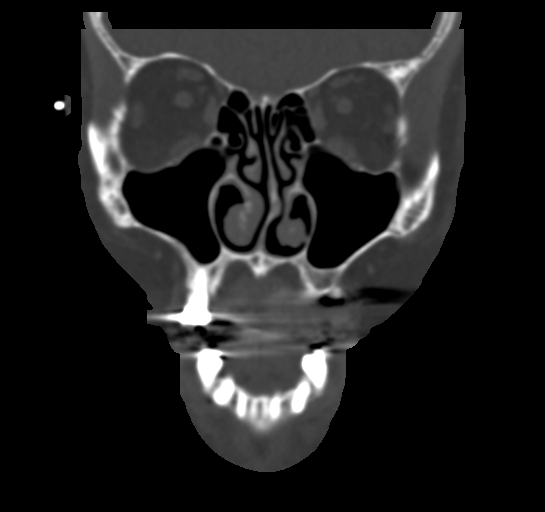

[Series 8: sagittal soft · sagittal · 0.25mm/px · 3 of 79 slices shown]
[im 27/79  bone]
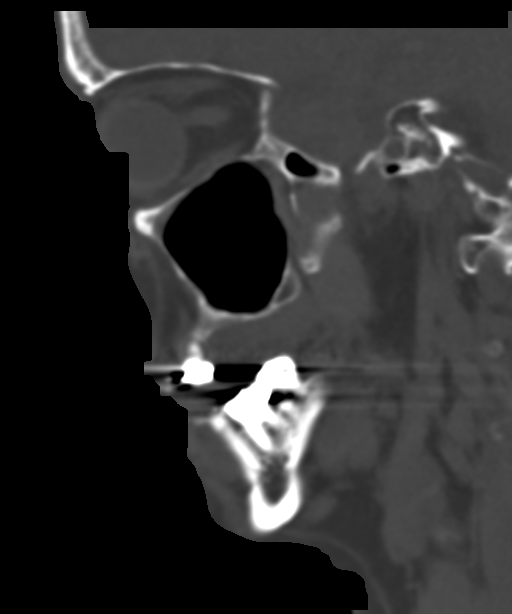
[im 40/79  bone]
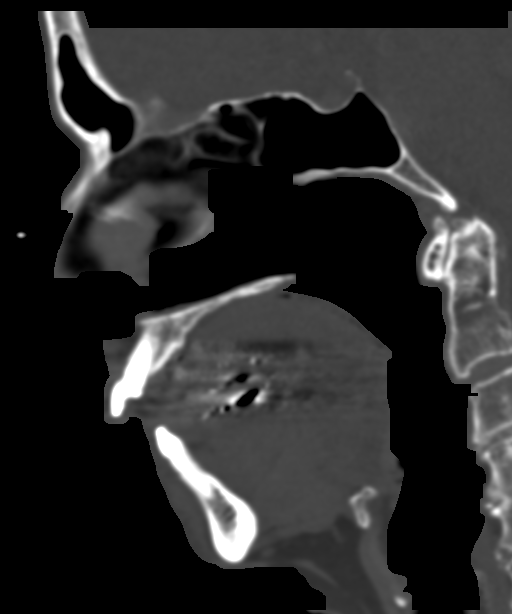
[im 53/79  bone]
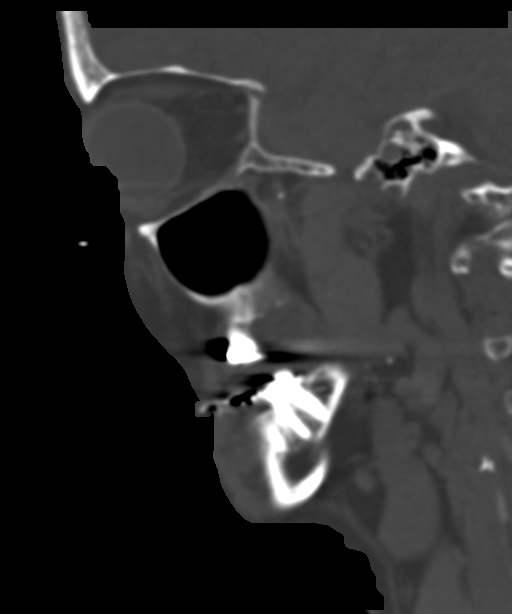

[15 of 47 positions shown; findings below may reference images not displayed]

FINDINGS: CT HEAD FINDINGS

Brain: No acute intracranial hemorrhage. No midline shift or mass
effect. Gray-white differentiation maintained. Mild volume loss.
Minimal periventricular patchy hypodensity. Unremarkable appearance
of the ventricular system.

Vascular: Calcifications of the intracranial vasculature

Skull: No acute fracture.  No aggressive bone lesion identified.

Sinuses/Orbits: Unremarkable appearance of the orbits. Mastoid air
cells clear. No middle ear effusion. No significant sinus disease.

CT MAXILLOFACIAL FINDINGS

Osseous: No fracture or mandibular dislocation. No destructive
process.

Orbits: Unremarkable orbits.

Sinuses: Unremarkable appearance of the paranasal sinuses.

Soft tissues: Unremarkable

CT CERVICAL SPINE FINDINGS

Alignment: Craniocervical junction aligned. Anatomic alignment of
the cervical elements. No subluxation.

Skull base and vertebrae: No acute fracture at the skullbase.
Vertebral body heights relatively maintained. No acute fracture
identified.

Soft tissues and spinal canal: Unremarkable cervical soft tissues.
Lymph nodes are present, though not enlarged.

Disc levels: Disc space narrowing most pronounced spanning the
levels of C3-C7 with endplate sclerosis uncovertebral joint disease
and anterior osteophyte production. No significant bony canal
narrowing. Uncovertebral joint disease and associated facet disease
contributes to varying degrees of bilateral neural foraminal
narrowing.

Upper chest: Unremarkable appearance of the lung apices.

Other: Carotid calcifications.
IMPRESSION: Head CT:

Negative for intracranial abnormality

Maxillofacial CT:

Negative

Cervical spine CT:

No acute fracture or malalignment of the cervical spine.

## 2022-05-02 IMAGING — CT CT CERVICAL SPINE W/O CM
3 of 4 series · 13 of 33 positions shown, 16 images · non-contrast
Comparison: 01/28/2021

CLINICAL DATA: 86-year-old female with a history of head trauma



[Series 5: sagittal bone · sagittal · 0.29mm/px · 5 of 70 slices shown, 6 images]
[im 24/70  bone]
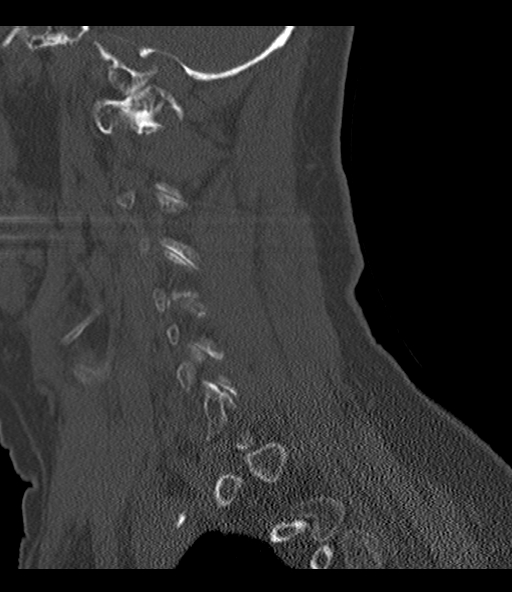
[im 29/70  bone]
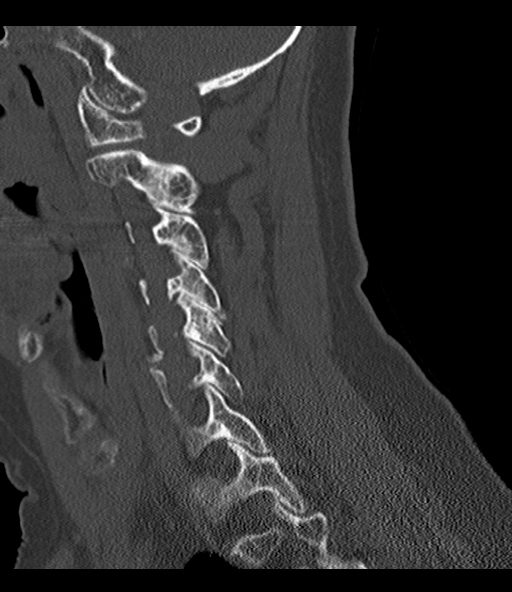
[im 35/70  soft-tissue]
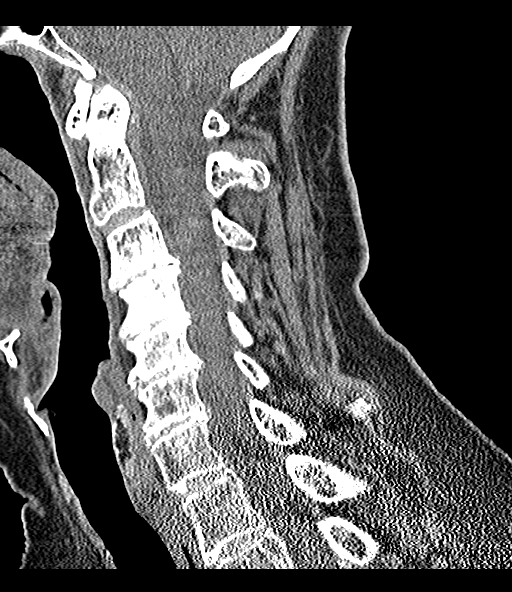
[im 35/70  bone]
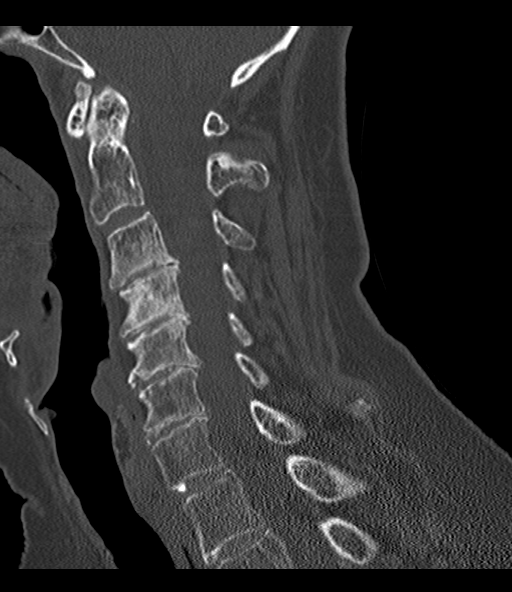
[im 41/70  bone]
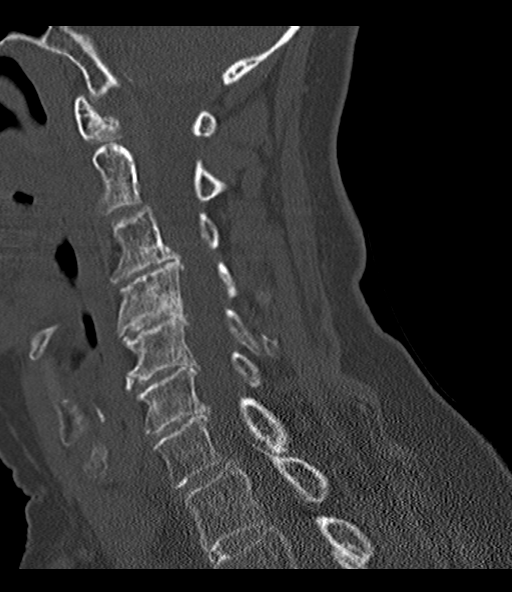
[im 47/70  bone]
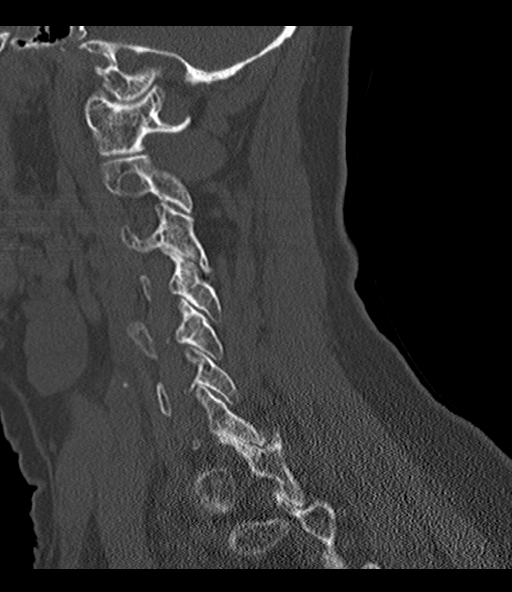

[Series 6: coronal bone · coronal · 0.34mm/px · 3 of 68 slices shown]
[im 14/68  bone]
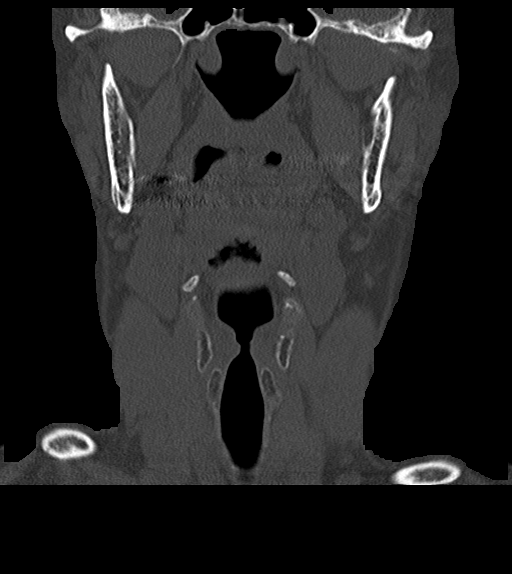
[im 27/68  bone]
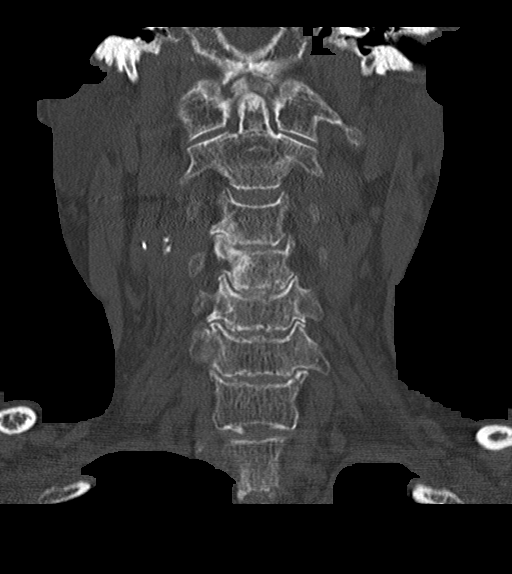
[im 41/68  bone]
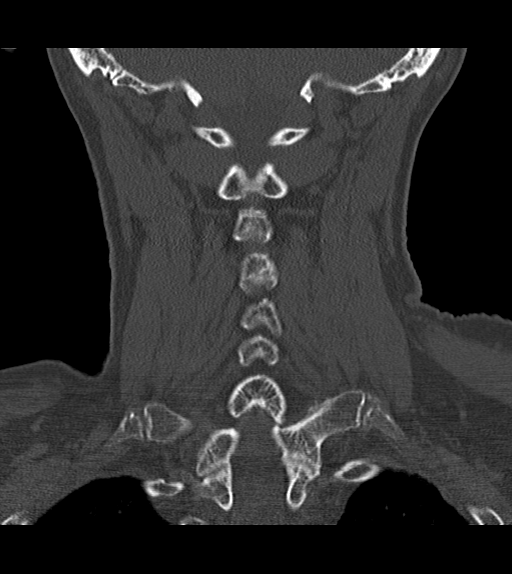

[Series 7: orthogonal axials · axial · 0.27mm/px · z∈[-346,-213]mm · 5 of 101 slices shown, 7 images]
[im 15/101  soft-tissue]
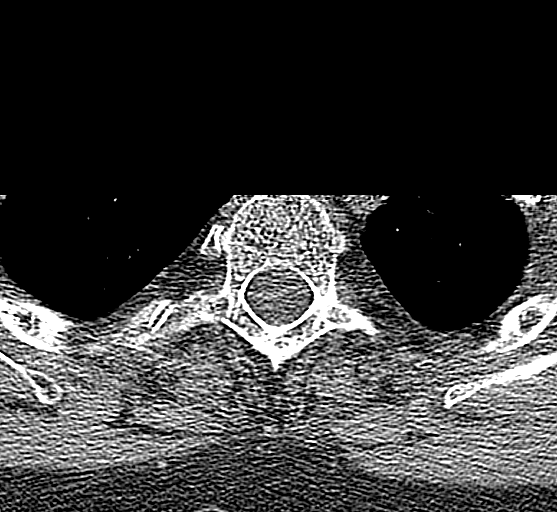
[im 15/101  bone]
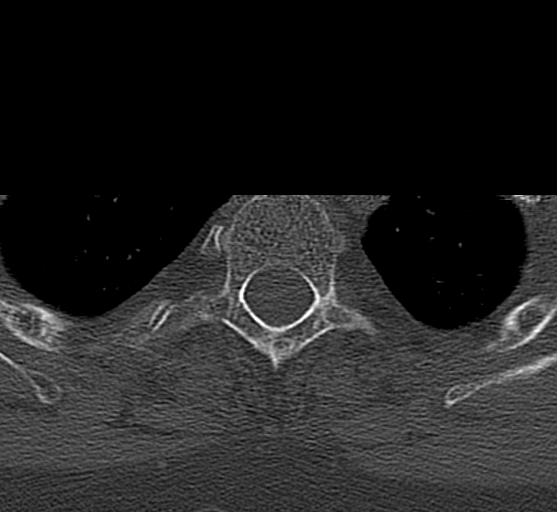
[im 29/101  bone]
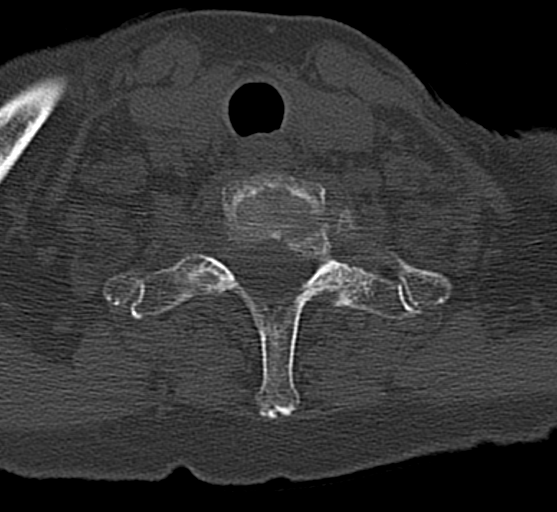
[im 58/101  bone]
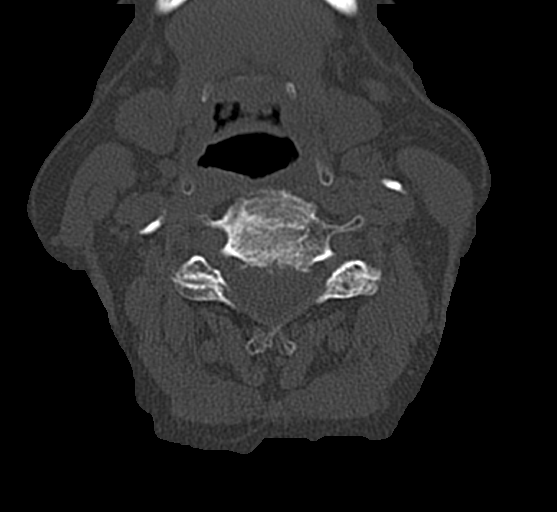
[im 72/101  bone]
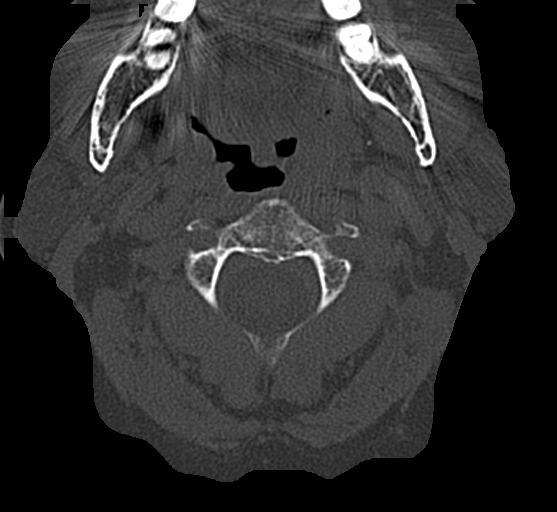
[im 86/101  soft-tissue]
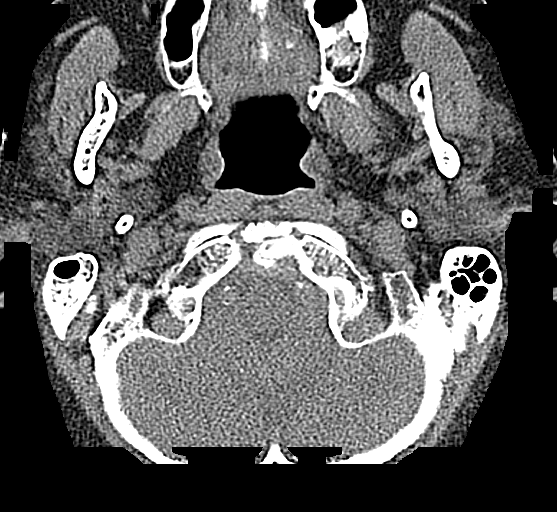
[im 86/101  bone]
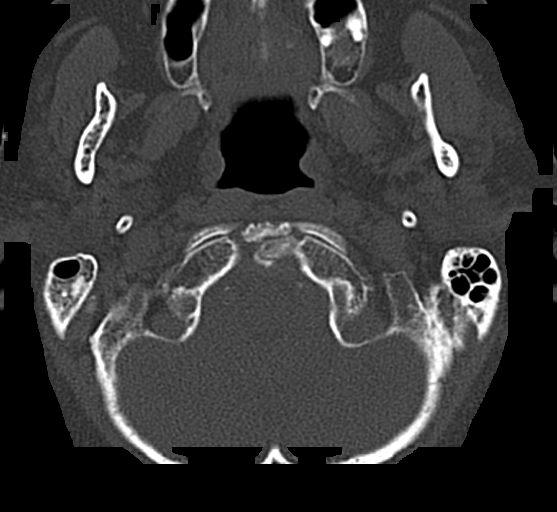

[13 of 33 positions shown; findings below may reference images not displayed]

FINDINGS: CT HEAD FINDINGS

Brain: No acute intracranial hemorrhage. No midline shift or mass
effect. Gray-white differentiation maintained. Mild volume loss.
Minimal periventricular patchy hypodensity. Unremarkable appearance
of the ventricular system.

Vascular: Calcifications of the intracranial vasculature

Skull: No acute fracture.  No aggressive bone lesion identified.

Sinuses/Orbits: Unremarkable appearance of the orbits. Mastoid air
cells clear. No middle ear effusion. No significant sinus disease.

CT MAXILLOFACIAL FINDINGS

Osseous: No fracture or mandibular dislocation. No destructive
process.

Orbits: Unremarkable orbits.

Sinuses: Unremarkable appearance of the paranasal sinuses.

Soft tissues: Unremarkable

CT CERVICAL SPINE FINDINGS

Alignment: Craniocervical junction aligned. Anatomic alignment of
the cervical elements. No subluxation.

Skull base and vertebrae: No acute fracture at the skullbase.
Vertebral body heights relatively maintained. No acute fracture
identified.

Soft tissues and spinal canal: Unremarkable cervical soft tissues.
Lymph nodes are present, though not enlarged.

Disc levels: Disc space narrowing most pronounced spanning the
levels of C3-C7 with endplate sclerosis uncovertebral joint disease
and anterior osteophyte production. No significant bony canal
narrowing. Uncovertebral joint disease and associated facet disease
contributes to varying degrees of bilateral neural foraminal
narrowing.

Upper chest: Unremarkable appearance of the lung apices.

Other: Carotid calcifications.
IMPRESSION: Head CT:

Negative for intracranial abnormality

Maxillofacial CT:

Negative

Cervical spine CT:

No acute fracture or malalignment of the cervical spine.

## 2023-05-03 ENCOUNTER — Encounter (HOSPITAL_BASED_OUTPATIENT_CLINIC_OR_DEPARTMENT_OTHER): Payer: Self-pay | Admitting: Emergency Medicine

## 2023-05-03 ENCOUNTER — Emergency Department (HOSPITAL_BASED_OUTPATIENT_CLINIC_OR_DEPARTMENT_OTHER)
Admission: EM | Admit: 2023-05-03 | Discharge: 2023-05-03 | Disposition: A | Discharge status: Home / Self Care | Payer: Medicare Other | Attending: Emergency Medicine | Admitting: Emergency Medicine

## 2023-05-03 ENCOUNTER — Emergency Department (HOSPITAL_BASED_OUTPATIENT_CLINIC_OR_DEPARTMENT_OTHER): Payer: Medicare Other

## 2023-05-03 ENCOUNTER — Other Ambulatory Visit: Payer: Self-pay

## 2023-05-03 DIAGNOSIS — Z7901 Long term (current) use of anticoagulants: Secondary | ICD-10-CM | POA: Diagnosis not present

## 2023-05-03 DIAGNOSIS — Z79899 Other long term (current) drug therapy: Secondary | ICD-10-CM | POA: Diagnosis not present

## 2023-05-03 DIAGNOSIS — I10 Essential (primary) hypertension: Secondary | ICD-10-CM | POA: Diagnosis not present

## 2023-05-03 DIAGNOSIS — F17210 Nicotine dependence, cigarettes, uncomplicated: Secondary | ICD-10-CM | POA: Insufficient documentation

## 2023-05-03 DIAGNOSIS — E119 Type 2 diabetes mellitus without complications: Secondary | ICD-10-CM | POA: Insufficient documentation

## 2023-05-03 DIAGNOSIS — R2242 Localized swelling, mass and lump, left lower limb: Secondary | ICD-10-CM | POA: Diagnosis present

## 2023-05-03 DIAGNOSIS — I82432 Acute embolism and thrombosis of left popliteal vein: Secondary | ICD-10-CM

## 2023-05-03 DIAGNOSIS — Z7982 Long term (current) use of aspirin: Secondary | ICD-10-CM | POA: Diagnosis not present

## 2023-05-03 MED ORDER — RIVAROXABAN (XARELTO) VTE STARTER PACK (15 & 20 MG): ORAL_TABLET | ORAL | 0 refills | Status: AC

## 2023-05-03 NOTE — ED Provider Notes (Addendum)
[facility] AT [facility] Provider Note   CSN: [id number] Arrival date & time: 05/03/23  1533     History  Chief Complaint  Patient presents with   Joint Swelling    Rachel Flynn is a 88 y.o. female.  Patient with a complaint of swelling to the left foot and ankle for 2 days no known injury no pain associated with it.  No chest pain or shortness of breath.  Sent in from [facility] for Doppler study to rule out DVT.  Patient is not on any blood thinners.  Past medical history significant for diabetes hypertension high cholesterol renal insufficiency.  Patient is an everyday smoker.  Could be a risk.  Patient's renal function in June by her primary care doctor was normal.       Home Medications Prior to Admission medications   Medication Sig Start Date End Date Taking? Authorizing Provider  RIVAROXABAN Carlena Hurl) VTE STARTER PACK (15 & 20 MG) Follow package directions: Take one 15mg  tablet by mouth twice a day. On day 22, switch to one 20mg  tablet once a day. Take with food. 05/03/23  Yes Vanetta Mulders, MD  amLODipine (NORVASC) 5 MG tablet Take 5 mg by mouth every morning. 07/06/16   [provider]  aspirin 81 MG EC tablet Take by mouth.    [provider]  atorvastatin (LIPITOR) 40 MG tablet Take 40 mg by mouth daily. 05/19/16   [provider]  Blood Glucose Monitoring Suppl (ONETOUCH VERIO) w/Device KIT Use as directed 10/29/19   [provider]  Desoximetasone 0.05 % GEL Apply 1 application topically daily as needed (skin).  05/30/16   [provider]  glucose blood (ONETOUCH VERIO) test strip CHECK BLOOD SUGARS DAILY AND AS NEEDED (E11.9) 01/06/20   [provider]  ibuprofen (ADVIL,MOTRIN) 600 MG tablet Take 600 mg by mouth every 8 (eight) hours as needed for mild pain or moderate pain.  07/08/16   [provider]  nabumetone (RELAFEN) 500 MG tablet Take by mouth. 09/09/19    [provider]  NIFEdipine (PROCARDIA-XL/NIFEDICAL-XL) 30 MG 24 hr tablet Take by mouth. 10/21/19   [provider]  Omega-3 1000 MG CAPS Take by mouth.    [provider]  OneTouch Delica Lancets 33G MISC Use 1 lancet daily to test blood sugar (dx E11.9) 05/03/16   [provider]  Tafluprost, PF, 0.0015 % SOLN Place 1 drop into both eyes nightly. 08/05/18   [provider]  tizanidine (ZANAFLEX) 2 MG capsule Take by mouth. 09/30/19   [provider]  valsartan-hydrochlorothiazide (DIOVAN-HCT) 320-12.5 MG tablet Take 1 tablet by mouth every morning. 06/26/16   [provider]      Allergies    Valsartan, Nifedipine, Ace inhibitors, Amoxicillin-pot clavulanate, and Sertraline    Review of Systems   Review of Systems  Constitutional:  Negative for chills and fever.  HENT:  Negative for ear pain and sore throat.   Eyes:  Negative for pain and visual disturbance.  Respiratory:  Negative for cough and shortness of breath.   Cardiovascular:  Positive for leg swelling. Negative for chest pain and palpitations.  Gastrointestinal:  Negative for abdominal pain and vomiting.  Genitourinary:  Negative for dysuria and hematuria.  Musculoskeletal:  Negative for arthralgias and back pain.  Skin:  Negative for color change and rash.  Neurological:  Negative for seizures and syncope.  All other systems reviewed and are negative.   Physical Exam  Updated Vital Signs BP (!) 145/99 (BP Location: Left Arm)   Pulse 74   Temp 98 F (36.7 C) (Oral)   Resp 20   Ht 1.651 m (5\' 5" )   Wt 60.8 kg   SpO2 95%   BMI 22.30 kg/m  Physical Exam Vitals and nursing note reviewed.  Constitutional:      General: She is not in acute distress.    Appearance: She is well-developed. She is not ill-appearing.  HENT:     Head: Normocephalic and atraumatic.  Eyes:     Conjunctiva/sclera: Conjunctivae normal.  Cardiovascular:     Rate and Rhythm: Normal  rate and regular rhythm.     Heart sounds: No murmur heard. Pulmonary:     Effort: Pulmonary effort is normal. No respiratory distress.     Breath sounds: Normal breath sounds. No wheezing or rales.  Abdominal:     Palpations: Abdomen is soft.     Tenderness: There is no abdominal tenderness.  Musculoskeletal:        General: Swelling present.     Cervical back: Neck supple.     Left lower leg: Edema present.  Skin:    General: Skin is warm and dry.     Capillary Refill: Capillary refill takes less than 2 seconds.  Neurological:     Mental Status: She is alert.  Psychiatric:        Mood and Affect: Mood normal.     ED Results / Procedures / Treatments   Labs (all labs ordered are listed, but only abnormal results are displayed) Labs Reviewed - No data to display  EKG None  Radiology US Venous Img Lower  Left (DVT Study)  Result Date: 05/03/2023 CLINICAL DATA:  left leg swelling EXAM: LEFT LOWER EXTREMITY VENOUS DOPPLER ULTRASOUND TECHNIQUE: Gray-scale sonography with compression, as well as color and duplex ultrasound, were performed to evaluate the deep venous system(s) from the level of the common femoral vein through the popliteal and proximal calf veins. COMPARISON:  LEFT lower extremity venous duplex 08/24/2022. CTA CAP 12/22/2021. FINDINGS: VENOUS Heterogeneously-echogenic, occlusive filling defect within and noncompressibility of the imaged portions of LEFT common femoral, superficial femoral, and popliteal veins. Visualized portions of profunda femoral vein and great saphenous vein, as well as the visualized calf veins unremarkable. Limited views of the contralateral common femoral vein are unremarkable. OTHER No evidence of superficial thrombophlebitis or abnormal fluid collection. Limitations: none IMPRESSION: Acute extensive occlusive LEFT femoropopliteal DVT. The central extension of the thrombus is not visualized on this evaluation. Recommend CT venogram abdomen pelvis  to evaluate/rule out pelvic versus caval extension, and consider consultation by vascular interventional specialist for potential further evaluation and management. These results will be called to the ordering clinician or representative by the Radiologist Assistant, and communication documented in the PACS or [employer]. Roanna Banning, MD [facility] Radiology Electronically Signed   By: Roanna Banning M.D.   On: 05/03/2023 16:59    Procedures Procedures    Medications Ordered in ED Medications - No data to display  ED Course/ Medical Decision Making/ A&P                                 Medical Decision Making Risk Prescription drug management.  Doppler study consistent with DVT the left femoral-popliteal area.  Patient without any chest pain or shortness of breath.  There is some swelling to the  left foot and left ankle good cap refill.  Neurovascularly intact.  No erythema no significant swelling.  Patient will be started on Xarelto and follow back up with her primary care doctor.  Precautions provided.  Patient without any history of any bleeding problems.   Final Clinical Impression(s) / ED Diagnoses Final diagnoses:  Acute deep vein thrombosis (DVT) of popliteal vein of left lower extremity (HCC)    Rx / DC Orders ED Discharge Orders          Ordered    RIVAROXABAN (XARELTO) VTE STARTER PACK (15 & 20 MG)        05/03/23 1745              Vanetta Mulders, MD 05/03/23 1749    Vanetta Mulders, MD 05/03/23 [phone]

## 2023-05-03 NOTE — Discharge Instructions (Signed)
Take the Xarelto as directed.  Make an appointment follow back up with your primary care doctor.  The ultrasound study did confirm a deep vein thrombosis in the left leg kind of behind the knee.  Return for any shortness of breath or chest pain.

## 2023-05-03 NOTE — ED Notes (Signed)
D/c paperwork reviewed with pt, including prescriptions and follow up care.  All questions and/or concerns addressed at time of d/c.  No further needs expressed. . Pt verbalized understanding, Ambulatory with family to ED exit, NAD.   

## 2023-05-03 NOTE — ED Triage Notes (Signed)
Left ankle swelling x 2 days.  No known injury,   No known fever.  Denies calf pain.  Pt sent from urgent care to be checked for blood clot.

## 2024-02-23 ENCOUNTER — Emergency Department (HOSPITAL_BASED_OUTPATIENT_CLINIC_OR_DEPARTMENT_OTHER): Admission: EM | Admit: 2024-02-23 | Discharge: 2024-02-23 | Disposition: A | Discharge status: Home / Self Care

## 2024-02-23 ENCOUNTER — Emergency Department (HOSPITAL_BASED_OUTPATIENT_CLINIC_OR_DEPARTMENT_OTHER)

## 2024-02-23 ENCOUNTER — Encounter (HOSPITAL_BASED_OUTPATIENT_CLINIC_OR_DEPARTMENT_OTHER): Payer: Self-pay | Admitting: Emergency Medicine

## 2024-02-23 DIAGNOSIS — W06XXXA Fall from bed, initial encounter: Secondary | ICD-10-CM | POA: Diagnosis not present

## 2024-02-23 DIAGNOSIS — Z7901 Long term (current) use of anticoagulants: Secondary | ICD-10-CM | POA: Diagnosis not present

## 2024-02-23 DIAGNOSIS — Z7982 Long term (current) use of aspirin: Secondary | ICD-10-CM | POA: Diagnosis not present

## 2024-02-23 DIAGNOSIS — S92415A Nondisplaced fracture of proximal phalanx of left great toe, initial encounter for closed fracture: Secondary | ICD-10-CM | POA: Diagnosis not present

## 2024-02-23 DIAGNOSIS — M25511 Pain in right shoulder: Secondary | ICD-10-CM | POA: Insufficient documentation

## 2024-02-23 DIAGNOSIS — S99922A Unspecified injury of left foot, initial encounter: Secondary | ICD-10-CM | POA: Diagnosis present

## 2024-02-23 MED ORDER — ACETAMINOPHEN 500 MG PO TABS
1000.0000 mg | ORAL_TABLET | Freq: Once | ORAL | Status: AC
  Administered 2024-02-23: 1000 mg via ORAL
  Filled 2024-02-23: qty 2

## 2024-02-23 NOTE — ED Provider Notes (Signed)
 [facility] Provider Note   CSN: [id number] Arrival date & time: 02/23/24  1312     History  Chief Complaint  Patient presents with   Rachel Flynn is a 89 y.o. female.  89 year old female female presenting emergency department with toe pain and shoulder pain after rolling off of her bed while asleep.  She denies hitting her head or having headache vision changes or pain.  Notes pain to her right shoulder, no chest pain or shortness of breath no abdominal pain.  She is primarily concerned however with her left great toe.  It is red and painful.  Did not take medications prior to arrival.  Able to ambulate   Fall       Home Medications Prior to Admission medications   Medication Sig Start Date End Date Taking? Authorizing Provider  amLODipine (NORVASC) 5 MG tablet Take 5 mg by mouth every morning. 07/06/16   [provider]  aspirin 81 MG EC tablet Take by mouth.    [provider]  atorvastatin (LIPITOR) 40 MG tablet Take 40 mg by mouth daily. 05/19/16   [provider]  Blood Glucose Monitoring Suppl (ONETOUCH VERIO) w/Device KIT Use as directed 10/29/19   [provider]  Desoximetasone 0.05 % GEL Apply 1 application topically daily as needed (skin).  05/30/16   [provider]  glucose blood (ONETOUCH VERIO) test strip CHECK BLOOD SUGARS DAILY AND AS NEEDED (E11.9) 01/06/20   [provider]  ibuprofen (ADVIL,MOTRIN) 600 MG tablet Take 600 mg by mouth every 8 (eight) hours as needed for mild pain or moderate pain.  07/08/16   [provider]  nabumetone (RELAFEN) 500 MG tablet Take by mouth. 09/09/19   [provider]  NIFEdipine (PROCARDIA-XL/NIFEDICAL-XL) 30 MG 24 hr tablet Take by mouth. 10/21/19   [provider]  Omega-3 1000 MG CAPS Take by mouth.    [provider]  OneTouch Delica Lancets 33G MISC Use 1 lancet daily to test blood sugar (dx  E11.9) 05/03/16   [provider]  RIVAROXABAN  (XARELTO ) VTE STARTER PACK (15 & 20 MG) Follow package directions: Take one 15mg  tablet by mouth twice a day. On day 22, switch to one 20mg  tablet once a day. Take with food. 05/03/23   Zackowski, Scott, MD  Tafluprost, PF, 0.0015 % SOLN Place 1 drop into both eyes nightly. 08/05/18   [provider]  tizanidine (ZANAFLEX) 2 MG capsule Take by mouth. 09/30/19   [provider]  valsartan-hydrochlorothiazide (DIOVAN-HCT) 320-12.5 MG tablet Take 1 tablet by mouth every morning. 06/26/16   [provider]      Allergies    Valsartan, Nifedipine, Ace inhibitors, Amoxicillin-pot clavulanate, and Sertraline    Review of Systems   Review of Systems  Physical Exam Updated Vital Signs BP 117/79   Pulse (!) 101   Temp 98.3 F (36.8 C) (Oral)   Resp 18   Ht 5\' 5"  (1.651 m)   Wt 49.9 kg   SpO2 96%   BMI 18.30 kg/m  Physical Exam Vitals and nursing note reviewed.  Constitutional:      General: She is not in acute distress.    Appearance: She is not toxic-appearing.  HENT:     Head: Normocephalic and atraumatic.     Nose: Nose normal.     Mouth/Throat:     Mouth: Mucous membranes are moist.  Eyes:     Pupils: Pupils  are equal, round, and reactive to light.  Cardiovascular:     Rate and Rhythm: Normal rate.  Pulmonary:     Effort: Pulmonary effort is normal.     Breath sounds: Normal breath sounds.  Abdominal:     General: Abdomen is flat. There is no distension.     Palpations: Abdomen is soft.     Tenderness: There is no abdominal tenderness. There is no guarding or rebound.  Musculoskeletal:        General: Normal range of motion.     Cervical back: Normal range of motion. No tenderness.     Comments: Left great toe with some bruising and erythema.  Brisk cap refill.  2+ DP pulses bilaterally.  She has no chest wall tenderness, pelvis stable nontender.  No other bony tenderness.  5 out of 5  plantarflexion dorsiflexion.  Right shoulder with no tenderness.  Full ROM able to put her arm behind her head and her back.  Neurological:     Mental Status: She is alert and oriented to person, place, and time. Mental status is at baseline.  Psychiatric:        Mood and Affect: Mood normal.        Behavior: Behavior normal.     ED Results / Procedures / Treatments   Labs (all labs ordered are listed, but only abnormal results are displayed) Labs Reviewed - No data to display  EKG None  Radiology DG Foot 2 Views Left Result Date: 02/23/2024 CLINICAL DATA:  Rachel Flynn, left great toe pain EXAM: LEFT FOOT - 2 VIEW COMPARISON:  None Available. FINDINGS: Frontal and lateral views of the left foot are obtained. There is a nondisplaced intra-articular fracture through the lateral margin of the distal aspect of the first proximal phalanx. Alignment is anatomic. No other acute bony abnormalities. Mild diffuse osteoarthritis greatest at the first metatarsophalangeal joint. Soft tissue swelling of the first digit. IMPRESSION: 1. Nondisplaced intra-articular fracture lateral margin distal aspect first proximal phalanx. 2. Mild diffuse osteoarthritis. Electronically Signed   By: Bobbye Burrow M.D.   On: 02/23/2024 14:05   DG Shoulder Right Result Date: 02/23/2024 CLINICAL DATA:  Rachel Flynn out of bed, right shoulder pain EXAM: RIGHT SHOULDER - 2+ VIEW COMPARISON:  01/28/2023 FINDINGS: Frontal, transscapular, and axillary views of the right shoulder are obtained. No fracture, subluxation, or dislocation. Moderate acromioclavicular and glenohumeral joint osteoarthritis. Calcific tendinopathy at the insertion of the supraspinatus tendon. Soft tissues are unremarkable. Right chest is clear. IMPRESSION: 1. Moderate degenerative changes of the right shoulder. No acute displaced fracture. Electronically Signed   By: Bobbye Burrow M.D.   On: 02/23/2024 14:03    Procedures Procedures    Medications Ordered in  ED Medications  acetaminophen  (TYLENOL ) tablet 1,000 mg (1,000 mg Oral Given 02/23/24 1346)    ED Course/ Medical Decision Making/ A&P                                 Medical Decision Making Is a 89 year old female presenting emergency department after a fall while rolling out of bed this morning.  Vital signs reassuring.  Physical exam with some bruising to her left toe no other signs of trauma.  No bruising or tenderness on exam.  Per chart review not taking blood thinner.  Husband is at bedside notes that she is at her baseline mentation.  Clear and equal lungs bilaterally.  X-rays revealed fracture of her  toe, no abnormalities on her shoulder x-ray.  Given Tylenol  here.  Placed in postop shoe.  Will have patient follow-up with Ortho.  Stable for discharge at this time.  Amount and/or Complexity of Data Reviewed Radiology: ordered.    Details: Considered CT head, however she did not hit her head and has no signs of trauma.  Not on blood thinner.  Is at her baseline with all occurring early this morning/last night.  Low suspicion for acute traumatic intracranial pathology at this time.  Risk OTC drugs. Decision regarding hospitalization. Diagnosis or treatment significantly limited by social determinants of health. Risk Details: Poor health literacy         Final Clinical Impression(s) / ED Diagnoses Final diagnoses:  Closed nondisplaced fracture of proximal phalanx of left great toe, initial encounter    Rx / DC Orders ED Discharge Orders     None         Rolinda Climes, DO 02/23/24 1413

## 2024-02-23 NOTE — ED Triage Notes (Signed)
 Pt sts she fell out of the bed last night; c/o pain to RT shoulder and LT great toe; denies hitting head;  denies taking blood thinners

## 2024-02-23 NOTE — Discharge Instructions (Signed)
 You broke your toe.  Please use the postop shoe for comfort and follow-up with the orthopedic doctor in 1 week.  May take over-the-counter medication such as Tylenol  for pain.  You may also ice the toe, elevated and rest to help heal.  Your shoulder x-ray was negative for fracture or dislocation.  Please follow-up with your primary doctor.  Return immediately if develop fevers, chills, chest pain, shortness of breath, abdominal pain, nausea vomiting or any new or worsening symptoms that are concerning to you.
